# Patient Record
Sex: Male | Born: 1985
Health system: Southern US, Community
[De-identification: ages and names within clinical notes are randomized; demographics above are authoritative.]

## PROBLEM LIST (undated history)

## (undated) DIAGNOSIS — Z789 Other specified health status: Secondary | ICD-10-CM

---

## 2015-04-24 ENCOUNTER — Emergency Department
Admission: EM | Admit: 2015-04-24 | Discharge: 2015-04-24 | Disposition: A | Discharge status: Home / Self Care | Payer: BLUE CROSS/BLUE SHIELD | Attending: Emergency Medicine | Admitting: Emergency Medicine

## 2015-04-24 ENCOUNTER — Encounter: Payer: Self-pay | Admitting: Emergency Medicine

## 2015-04-24 DIAGNOSIS — Z720 Tobacco use: Secondary | ICD-10-CM | POA: Insufficient documentation

## 2015-04-24 DIAGNOSIS — R197 Diarrhea, unspecified: Secondary | ICD-10-CM | POA: Diagnosis not present

## 2015-04-24 DIAGNOSIS — R109 Unspecified abdominal pain: Secondary | ICD-10-CM | POA: Diagnosis present

## 2015-04-24 LAB — URINALYSIS COMPLETE WITH MICROSCOPIC (ARMC ONLY)
Bacteria, UA: NONE SEEN
Bilirubin Urine: NEGATIVE
Glucose, UA: NEGATIVE mg/dL
Hgb urine dipstick: NEGATIVE
Leukocytes, UA: NEGATIVE
Nitrite: NEGATIVE
Protein, ur: NEGATIVE mg/dL
Specific Gravity, Urine: 1.021 (ref 1.005–1.030)
pH: 6 (ref 5.0–8.0)

## 2015-04-24 LAB — COMPREHENSIVE METABOLIC PANEL
ALT: 37 U/L (ref 17–63)
AST: 28 U/L (ref 15–41)
Albumin: 4.4 g/dL (ref 3.5–5.0)
Alkaline Phosphatase: 69 U/L (ref 38–126)
Anion gap: 10 (ref 5–15)
BUN: 11 mg/dL (ref 6–20)
CO2: 24 mmol/L (ref 22–32)
Calcium: 9.3 mg/dL (ref 8.9–10.3)
Chloride: 104 mmol/L (ref 101–111)
Creatinine, Ser: 1.04 mg/dL (ref 0.61–1.24)
GFR calc Af Amer: >60 mL/min (ref >60)
GFR calc non Af Amer: >60 mL/min (ref >60)
Glucose, Bld: 90 mg/dL (ref 65–99)
Potassium: 3.7 mmol/L (ref 3.5–5.1)
Sodium: 138 mmol/L (ref 135–145)
Total Bilirubin: 1.3 mg/dL — ABNORMAL HIGH (ref 0.3–1.2)
Total Protein: 7.4 g/dL (ref 6.5–8.1)

## 2015-04-24 LAB — CBC WITH DIFFERENTIAL/PLATELET
Basophils Absolute: 0 10*3/uL (ref 0–0.1)
Basophils Relative: 1 %
Eosinophils Absolute: 0.1 10*3/uL (ref 0–0.7)
Eosinophils Relative: 2 %
HCT: 51.1 % (ref 40.0–52.0)
Hemoglobin: 17.3 g/dL (ref 13.0–18.0)
Lymphocytes Relative: 23 %
Lymphs Abs: 1.9 10*3/uL (ref 1.0–3.6)
MCH: 30.9 pg (ref 26.0–34.0)
MCHC: 33.8 g/dL (ref 32.0–36.0)
MCV: 91.3 fL (ref 80.0–100.0)
Monocytes Absolute: 0.8 10*3/uL (ref 0.2–1.0)
Monocytes Relative: 9 %
Neutro Abs: 5.3 10*3/uL (ref 1.4–6.5)
Neutrophils Relative %: 65 %
Platelets: 220 10*3/uL (ref 150–440)
RBC: 5.6 MIL/uL (ref 4.40–5.90)
RDW: 12.7 % (ref 11.5–14.5)
WBC: 8.1 10*3/uL (ref 3.8–10.6)

## 2015-04-24 MED ORDER — DICYCLOMINE HCL 10 MG PO CAPS
20.0000 mg | ORAL_CAPSULE | Freq: Once | ORAL | Status: AC
  Administered 2015-04-24: 20 mg via ORAL

## 2015-04-24 MED ORDER — DICYCLOMINE HCL 20 MG PO TABS: 20.0000 mg | ORAL_TABLET | Freq: Three times a day (TID) | ORAL | Status: DC | PRN

## 2015-04-24 MED ORDER — DICYCLOMINE HCL 10 MG PO CAPS
ORAL_CAPSULE | ORAL | Status: AC
  Administered 2015-04-24: 20 mg via ORAL
  Filled 2015-04-24: qty 2

## 2015-04-24 NOTE — ED Provider Notes (Signed)
[facility] Emergency Department Provider Note  Time seen: 6:10 PM  I have reviewed the triage vital signs and the nursing notes.   HISTORY  Chief Complaint Abdominal Pain    HPI Alex Cruz is a 29 y.o. male with no past medical history presents the emergency department for 3 weeks of abdominal cramping and diarrhea. According to the patient for the past 3 weeks he has been having loose stool every day approximately 4-5 times per day. He went to an urgent care 6 days ago and they prescribed him with ciprofloxacin and Flagyl. He states he has been taking these but has not had any relief. Denies any abdominal pain, bloody stool or black stool. He does state he occasionally feels cramping sensation in the abdomen especially before having a bowel movement. Denies any fever. Describes his symptoms as moderate in severity. He is not taking any Imodium.     History reviewed. No pertinent past medical history.  There are no active problems to display for this patient.   History reviewed. No pertinent past surgical history.  No current outpatient prescriptions on file.  Allergies Review of patient's allergies indicates no known allergies.  No family history on file.  Social History History  Substance Use Topics  . Smoking status: Current Every Day Smoker    Types: Cigarettes  . Smokeless tobacco: Not on file  . Alcohol Use: Yes     Comment: social    Review of Systems Constitutional: Negative for fever. Cardiovascular: Negative for chest pain. Respiratory: Negative for shortness of breath. Gastrointestinal: Positive for abdominal cramping and diarrhea. Negative for nausea or vomiting. Genitourinary: Negative for dysuria. Musculoskeletal: Negative for back pain. 10-point ROS otherwise negative.  ____________________________________________   PHYSICAL EXAM:  VITAL SIGNS: ED Triage Vitals  Enc Vitals Group     BP 04/24/15 1713 140/95 mmHg      Pulse Rate 04/24/15 1713 88     Resp 04/24/15 1713 18     Temp 04/24/15 1713 98.3 F (36.8 C)     Temp Source 04/24/15 1713 Oral     SpO2 04/24/15 1713 97 %     Weight 04/24/15 1713 183 lb (83.008 kg)     Height 04/24/15 1713  (1.702 m)     Head Cir --      Peak Flow --      Pain Score 04/24/15 1713 8     Pain Loc --      Pain Edu? --      Excl. in GC? --     Constitutional: Alert and oriented. Well appearing and in no distress. ENT   Mouth/Throat: Mucous membranes are moist. Cardiovascular: Normal rate, regular rhythm. No murmur Respiratory: Normal respiratory effort without tachypnea nor retractions. Breath sounds are clear and equal Gastrointestinal: Soft and nontender. No distention.  There is no CVA tenderness. Musculoskeletal: Nontender with normal range of motion in all extremities. Neurologic:  Normal speech and language. No gross focal neurologic deficits Skin:  Skin is warm, dry and intact.  Psychiatric: Mood and affect are normal. Speech and behavior are normal.   ____________________________________________    INITIAL IMPRESSION / ASSESSMENT AND PLAN / ED COURSE  Pertinent labs & imaging results that were available during my care of the patient were reviewed by me and considered in my medical decision making (see chart for details).  Largely normal exam, no abdominal tenderness, no CVA tenderness. We will check labs, urinalysis, and treat with Bentyl. Patient currently  taking Cipro and Flagyl. If labs are within normal limits we will discharge patient on Bentyl, I discussed using Imodium with the patient which is agreeable. He will follow-up with his primary care doctor.  Labs largely within normal limits, we'll discharge the patient on Bentyl, Imodium as needed, and primary care follow-up. Patient agreeable to plan.  ____________________________________________   FINAL CLINICAL IMPRESSION(S) / ED DIAGNOSES  Diarrhea   Minna AntisKevin Jenean Escandon,  MD 04/24/15 [phone]

## 2015-04-24 NOTE — ED Notes (Signed)
Pt c/o abd cramping with diarrhea for 3 weeks now, ha the past few days.

## 2015-04-24 NOTE — Discharge Instructions (Signed)

## 2015-04-26 ENCOUNTER — Encounter: Payer: Self-pay | Admitting: Gastroenterology

## 2015-05-24 ENCOUNTER — Ambulatory Visit: Payer: BLUE CROSS/BLUE SHIELD | Admitting: Gastroenterology

## 2016-08-27 ENCOUNTER — Emergency Department
Admission: EM | Admit: 2016-08-27 | Discharge: 2016-08-27 | Disposition: A | Discharge status: Home / Self Care | Payer: BLUE CROSS/BLUE SHIELD | Attending: Emergency Medicine | Admitting: Emergency Medicine

## 2016-08-27 ENCOUNTER — Emergency Department: Payer: BLUE CROSS/BLUE SHIELD

## 2016-08-27 DIAGNOSIS — R1032 Left lower quadrant pain: Secondary | ICD-10-CM | POA: Diagnosis present

## 2016-08-27 DIAGNOSIS — K5732 Diverticulitis of large intestine without perforation or abscess without bleeding: Secondary | ICD-10-CM | POA: Diagnosis not present

## 2016-08-27 LAB — URINALYSIS COMPLETE WITH MICROSCOPIC (ARMC ONLY)
Bacteria, UA: NONE SEEN
Bilirubin Urine: NEGATIVE
Glucose, UA: NEGATIVE mg/dL
Hgb urine dipstick: NEGATIVE
Ketones, ur: NEGATIVE mg/dL
Leukocytes, UA: NEGATIVE
Nitrite: NEGATIVE
Protein, ur: NEGATIVE mg/dL
RBC / HPF: NONE SEEN RBC/hpf (ref 0–5)
Specific Gravity, Urine: 1.025 (ref 1.005–1.030)
Squamous Epithelial / LPF: NONE SEEN
pH: 6 (ref 5.0–8.0)

## 2016-08-27 LAB — COMPREHENSIVE METABOLIC PANEL
ALT: 44 U/L (ref 17–63)
AST: 34 U/L (ref 15–41)
Albumin: 4.3 g/dL (ref 3.5–5.0)
Alkaline Phosphatase: 61 U/L (ref 38–126)
Anion gap: 8 (ref 5–15)
BUN: 15 mg/dL (ref 6–20)
CO2: 26 mmol/L (ref 22–32)
Calcium: 9.2 mg/dL (ref 8.9–10.3)
Chloride: 105 mmol/L (ref 101–111)
Creatinine, Ser: 0.93 mg/dL (ref 0.61–1.24)
GFR calc Af Amer: >60 mL/min (ref >60)
GFR calc non Af Amer: >60 mL/min (ref >60)
Glucose, Bld: 100 mg/dL — ABNORMAL HIGH (ref 65–99)
Potassium: 4 mmol/L (ref 3.5–5.1)
Sodium: 139 mmol/L (ref 135–145)
Total Bilirubin: 0.7 mg/dL (ref 0.3–1.2)
Total Protein: 7.4 g/dL (ref 6.5–8.1)

## 2016-08-27 LAB — CBC
HCT: 48.3 % (ref 40.0–52.0)
Hemoglobin: 17 g/dL (ref 13.0–18.0)
MCH: 31.7 pg (ref 26.0–34.0)
MCHC: 35.2 g/dL (ref 32.0–36.0)
MCV: 90 fL (ref 80.0–100.0)
Platelets: 221 10*3/uL (ref 150–440)
RBC: 5.36 MIL/uL (ref 4.40–5.90)
RDW: 12.9 % (ref 11.5–14.5)
WBC: 6.9 10*3/uL (ref 3.8–10.6)

## 2016-08-27 LAB — LIPASE, BLOOD: Lipase: 20 U/L (ref 11–51)

## 2016-08-27 MED ORDER — METRONIDAZOLE 500 MG PO TABS
500.0000 mg | ORAL_TABLET | Freq: Once | ORAL | Status: AC
  Administered 2016-08-27: 500 mg via ORAL
  Filled 2016-08-27: qty 1

## 2016-08-27 MED ORDER — CIPROFLOXACIN HCL 500 MG PO TABS
500.0000 mg | ORAL_TABLET | Freq: Once | ORAL | Status: AC
  Administered 2016-08-27: 500 mg via ORAL
  Filled 2016-08-27: qty 1

## 2016-08-27 MED ORDER — OXYCODONE-ACETAMINOPHEN 5-325 MG PO TABS: 2.0000 | ORAL_TABLET | Freq: Four times a day (QID) | ORAL | 0 refills | Status: DC | PRN

## 2016-08-27 MED ORDER — ONDANSETRON HCL 4 MG PO TABS: 4.0000 mg | ORAL_TABLET | Freq: Every day | ORAL | 1 refills | Status: DC | PRN

## 2016-08-27 MED ORDER — HYDROMORPHONE HCL 1 MG/ML IJ SOLN
0.5000 mg | Freq: Once | INTRAMUSCULAR | Status: AC
  Administered 2016-08-27: 0.5 mg via INTRAVENOUS
  Filled 2016-08-27: qty 1

## 2016-08-27 MED ORDER — CIPROFLOXACIN HCL 500 MG PO TABS: 500.0000 mg | ORAL_TABLET | Freq: Two times a day (BID) | ORAL | 0 refills | Status: AC

## 2016-08-27 MED ORDER — METRONIDAZOLE 500 MG PO TABS: 500.0000 mg | ORAL_TABLET | Freq: Three times a day (TID) | ORAL | 0 refills | Status: DC

## 2016-08-27 MED ORDER — KETOROLAC TROMETHAMINE 30 MG/ML IJ SOLN
30.0000 mg | Freq: Once | INTRAMUSCULAR | Status: AC
  Administered 2016-08-27: 30 mg via INTRAVENOUS
  Filled 2016-08-27: qty 1

## 2016-08-27 NOTE — ED Triage Notes (Signed)
Pt c/o LLQ pain for the past 3 days.. Denies N/V/D.Marland Kitchen. Last BM was yesterday..Marland Kitchen

## 2016-08-27 NOTE — ED Provider Notes (Signed)
[facility] Emergency Department Provider Note        Time seen: ----------------------------------------- 9:37 AM on 08/27/2016 -----------------------------------------    I have reviewed the triage vital signs and the nursing notes.   HISTORY  Chief Complaint Abdominal Pain (LLQ)    HPI Alex Cruz is a 30 y.o. male who presents to the ER for left lower quadrant pain for the last 3 days. Patient states the pain is worse with movement, nothing makes it better. He denies fevers, chills, vomiting or diarrhea. He's never had a history of this before, denies blood in his stool or urine.   History reviewed. No pertinent past medical history.  There are no active problems to display for this patient.   History reviewed. No pertinent surgical history.  Allergies Patient has no known allergies.  Social History Social History  Substance Use Topics  . Smoking status: Never Smoker  . Smokeless tobacco: Never Used  . Alcohol use Yes     Comment: social    Review of Systems Constitutional: Negative for fever. Cardiovascular: Negative for chest pain. Respiratory: Negative for shortness of breath. Gastrointestinal: Positive for abdominal pain Genitourinary: Negative for dysuria. Musculoskeletal: Negative for back pain. Skin: Negative for rash. Neurological: Negative for headaches, focal weakness or numbness.  10-point ROS otherwise negative.  ____________________________________________   PHYSICAL EXAM:  VITAL SIGNS: ED Triage Vitals  Enc Vitals Group     BP 08/27/16 0840 133/77     Pulse Rate 08/27/16 0840 88     Resp 08/27/16 0840 20     Temp 08/27/16 0840 97.8 F (36.6 C)     Temp Source 08/27/16 0840 Oral     SpO2 08/27/16 0840 97 %     Weight 08/27/16 0840 185 lb (83.9 kg)     Height 08/27/16 0840 5\' 7"  (1.702 m)     Head Circumference --      Peak Flow --      Pain Score 08/27/16 0843 7     Pain Loc --      Pain Edu? --       Excl. in GC? --     Constitutional: Alert and oriented. Well appearing and in no distress. Eyes: Conjunctivae are normal. PERRL. Normal extraocular movements. ENT   Head: Normocephalic and atraumatic.   Nose: No congestion/rhinnorhea.   Mouth/Throat: Mucous membranes are moist.   Neck: No stridor. Cardiovascular: Normal rate, regular rhythm. No murmurs, rubs, or gallops. Respiratory: Normal respiratory effort without tachypnea nor retractions. Breath sounds are clear and equal bilaterally. No wheezes/rales/rhonchi. Gastrointestinal: Left lower quadrant tenderness, no rebound or guarding. Normal bowel sounds. Musculoskeletal: Nontender with normal range of motion in all extremities. No lower extremity tenderness nor edema. Neurologic:  Normal speech and language. No gross focal neurologic deficits are appreciated.  Skin:  Skin is warm, dry and intact. No rash noted. Psychiatric: Mood and affect are normal. Speech and behavior are normal.  ____________________________________________  ED COURSE:  Pertinent labs & imaging results that were available during my care of the patient were reviewed by me and considered in my medical decision making (see chart for details). Clinical Course   Patient is in no distress but very tender on examination. We will assess with labs and imaging.  Procedures ____________________________________________   LABS (pertinent positives/negatives)  Labs Reviewed  COMPREHENSIVE METABOLIC PANEL - Abnormal; Notable for the following:       Result Value   Glucose, Bld 100 (*)    All  other components within normal limits  URINALYSIS COMPLETEWITH MICROSCOPIC ([facility] ONLY) - Abnormal; Notable for the following:    Color, Urine YELLOW (*)    APPearance CLEAR (*)    All other components within normal limits  LIPASE, BLOOD  CBC    RADIOLOGY Images were viewed by me  CT renal protocol IMPRESSION: There is focal diverticulitis in the proximal  sigmoid colon with mild wall thickening and mesenteric thickening in this area. No abscess or perforation evident. There are diverticula throughout colon elsewhere without inflammation.  No bowel obstruction. No abscess. Appendix appears normal. Rectum is distended with stool.  Urinary bladder wall thickening is concerning for a degree of cystitis. There is no hydronephrosis on either side. No renal or ureteral calculi present on either side.  ____________________________________________  FINAL ASSESSMENT AND PLAN  Flank pain, diverticulitis  Plan: Patient with labs and imaging as dictated above. Patient with uncomplicated diverticulitis without leading her abscess. His labs are reassuring, he's been started on Cipro and Flagyl, he's been advised about increasing fiber intake. He is stable for outpatient follow-up with his doctor.   Emily FilbertWilliams, Alex E, MD   Note: This dictation was prepared with Dragon dictation. Any transcriptional errors that result from this process are unintentional    Emily FilbertJonathan E Williams, MD 08/27/16 1122

## 2016-11-29 ENCOUNTER — Ambulatory Visit
Admission: EM | Admit: 2016-11-29 | Discharge: 2016-11-29 | Disposition: A | Discharge status: Home / Self Care | Payer: BLUE CROSS/BLUE SHIELD | Attending: Family Medicine | Admitting: Family Medicine

## 2016-11-29 ENCOUNTER — Ambulatory Visit (INDEPENDENT_AMBULATORY_CARE_PROVIDER_SITE_OTHER): Payer: BLUE CROSS/BLUE SHIELD

## 2016-11-29 DIAGNOSIS — J189 Pneumonia, unspecified organism: Secondary | ICD-10-CM

## 2016-11-29 DIAGNOSIS — J9811 Atelectasis: Secondary | ICD-10-CM

## 2016-11-29 MED ORDER — PREDNISONE 10 MG (21) PO TBPK: ORAL_TABLET | ORAL | 0 refills | Status: DC

## 2016-11-29 MED ORDER — HYDROCOD POLST-CPM POLST ER 10-8 MG/5ML PO SUER: 5.0000 mL | Freq: Two times a day (BID) | ORAL | 0 refills | Status: DC | PRN

## 2016-11-29 MED ORDER — LEVOFLOXACIN 500 MG PO TABS: 500.0000 mg | ORAL_TABLET | Freq: Every day | ORAL | 0 refills | Status: DC

## 2016-11-29 NOTE — ED Triage Notes (Signed)
Pt c/o cough with sinus and chest congestion for the past month, states [facility] and was Rx abx and cough syrup and was feeling better until 3 days ago..Marland Kitchen

## 2016-11-29 NOTE — ED Provider Notes (Signed)
[facility]    CSN: [id number] Arrival date & time: 11/29/16  1618     History   Chief Complaint Chief Complaint  Patient presents with  . Cough    HPI Alex Cruz is a 31 y.o. male.   Patient is a 31 year old white male who is sitting reports having cough and congestion now for over a month. About 3 weeks ago he was seen at the [facility] and placed on doxycycline prednisone and a cough syrup. He reports initially improvement with the symptoms but then they've come back and the cough is progressively gotten worse. He's had no surgeries no past medical problems he does not smoke. There is no pertinent family medical history relevant to today's visit and no known drug allergies.   The history is provided by the patient. No language interpreter was used.  Cough  Cough characteristics:  Productive and hoarse Sputum characteristics:  Yellow and green Severity:  Moderate Duration:  4 weeks Timing:  Constant Progression:  Worsening Chronicity:  New Smoker: no   Context: upper respiratory infection   Context: not smoke exposure   Relieved by:  Cough suppressants and steroid inhaler (doxycycline) Worsened by:  Nothing   History reviewed. No pertinent past medical history.  There are no active problems to display for this patient.   History reviewed. No pertinent surgical history.     Home Medications    Prior to Admission medications   Medication Sig Start Date End Date Taking? Authorizing Provider  dextromethorphan-guaiFENesin (MUCINEX DM) 30-600 MG 12hr tablet Take 1 tablet by mouth 2 (two) times daily.   Yes Historical Provider, MD  chlorpheniramine-HYDROcodone (TUSSIONEX PENNKINETIC ER) 10-8 MG/5ML SUER Take 5 mLs by mouth every 12 (twelve) hours as needed for cough. 11/29/16   Hassan RowanEugene Aiden Rao, MD  levofloxacin (LEVAQUIN) 500 MG tablet Take 1 tablet (500 mg total) by mouth daily. 11/29/16   Hassan RowanEugene Elajah Kunsman, MD  metroNIDAZOLE (FLAGYL) 500 MG  tablet Take 1 tablet (500 mg total) by mouth 3 (three) times daily. 08/27/16   Emily FilbertJonathan E Williams, MD  ondansetron (ZOFRAN) 4 MG tablet Take 1 tablet (4 mg total) by mouth daily as needed for nausea or vomiting. 08/27/16   Emily FilbertJonathan E Williams, MD  oxyCODONE-acetaminophen (PERCOCET) 5-325 MG tablet Take 2 tablets by mouth every 6 (six) hours as needed for moderate pain or severe pain. 08/27/16   Emily FilbertJonathan E Williams, MD  predniSONE (STERAPRED UNI-PAK 21 TAB) 10 MG (21) TBPK tablet Sig 6 tablet day 1, 5 tablets day 2, 4 tablets day 3,,3tablets day 4, 2 tablets day 5, 1 tablet day 6 take all tablets orally 11/29/16   Hassan RowanEugene Deauna Yaw, MD    Family History No family history on file.  Social History Social History  Substance Use Topics  . Smoking status: Never Smoker  . Smokeless tobacco: Never Used  . Alcohol use Yes     Comment: social     Allergies   Patient has no known allergies.   Review of Systems Review of Systems  Respiratory: Positive for cough.   All other systems reviewed and are negative.    Physical Exam Triage Vital Signs ED Triage Vitals  Enc Vitals Group     BP 11/29/16 1658 (!) 151/69     Pulse Rate 11/29/16 1658 90     Resp 11/29/16 1658 18     Temp 11/29/16 1658 97.7 F (36.5 C)     Temp Source 11/29/16 1658 Oral  SpO2 11/29/16 1658 99 %     Weight 11/29/16 1659 192 lb (87.1 kg)     Height 11/29/16 1659 5\' 7"  (1.702 m)     Head Circumference --      Peak Flow --      Pain Score --      Pain Loc --      Pain Edu? --      Excl. in GC? --    No data found.   Updated Vital Signs BP (!) 151/69 (BP Location: Left Arm)   Pulse 90   Temp 97.7 F (36.5 C) (Oral)   Resp 18   Ht 5\' 7"  (1.702 m)   Wt 192 lb (87.1 kg)   SpO2 99%   BMI 30.07 kg/m   Visual Acuity Right Eye Distance:   Left Eye Distance:   Bilateral Distance:    Right Eye Near:   Left Eye Near:    Bilateral Near:     Physical Exam  Constitutional: He appears well-developed and  well-nourished. No distress.  HENT:  Head: Normocephalic.  Right Ear: External ear normal.  Left Ear: External ear normal.  Mouth/Throat: Oropharynx is clear and moist.  Eyes: Pupils are equal, round, and reactive to light.  Neck: Normal range of motion. Neck supple.  Cardiovascular: Normal rate and regular rhythm.   Pulmonary/Chest: Effort normal.  Musculoskeletal: Normal range of motion.  Neurological: He is alert.  Skin: Skin is warm. He is not diaphoretic.  Psychiatric: He has a normal mood and affect.  Vitals reviewed.    UC Treatments / Results  Labs (all labs ordered are listed, but only abnormal results are displayed) Labs Reviewed - No data to display  EKG  EKG Interpretation None       Radiology Dg Chest 2 View  Result Date: 11/29/2016 CLINICAL DATA:  Cough congestion EXAM: CHEST  2 VIEW COMPARISON:  None. FINDINGS: Normal cardiac silhouette. The subtle bandlike lingula lingular density seen on lateral projection. Normal pulmonary vasculature. No pleural fluid or pneumothorax. IMPRESSION: Lingular atelectasis versus less likely infiltrate. These results will be called to the ordering clinician or representative by the Radiologist Assistant, and communication documented in the PACS or zVision Dashboard. Electronically Signed   By: Genevive Bi M.D.   On: 11/29/2016 18:21    Procedures Procedures (including critical care time)  Medications Ordered in UC Medications - No data to display   Initial Impression / Assessment and Plan / UC Course  I have reviewed the triage vital signs and the nursing notes.  Pertinent labs & imaging results that were available during my care of the patient were reviewed by me and considered in my medical decision making (see chart for details).   patient with atelectasis on chest x-ray versus pneumonia. Will treat for pneumonia because of the left time the cough has been there place on Levaquin 500 mg 1 tablet a day Tussionex 1  teaspoon twice a day prednisone for 6 days. Work note for the next 2 days today and tomorrow follow-up PCP in about 3 weeks for repeat chest x-ray or if symptoms get worse.    Final Clinical Impressions(s) / UC Diagnoses   Final diagnoses:  Linear atelectasis  Community acquired pneumonia of left lung, unspecified part of lung    New Prescriptions New Prescriptions   CHLORPHENIRAMINE-HYDROCODONE (TUSSIONEX PENNKINETIC ER) 10-8 MG/5ML SUER    Take 5 mLs by mouth every 12 (twelve) hours as needed for cough.   LEVOFLOXACIN (LEVAQUIN) 500  MG TABLET    Take 1 tablet (500 mg total) by mouth daily.   PREDNISONE (STERAPRED UNI-PAK 21 TAB) 10 MG (21) TBPK TABLET    Sig 6 tablet day 1, 5 tablets day 2, 4 tablets day 3,,3tablets day 4, 2 tablets day 5, 1 tablet day 6 take all tablets orally     Note: This dictation was prepared with Dragon dictation along with smaller phrase technology. Any transcriptional errors that result from this process are unintentional.   Hassan Rowan, MD 11/29/16 2134

## 2017-06-03 ENCOUNTER — Encounter: Payer: Self-pay | Admitting: *Deleted

## 2017-06-03 ENCOUNTER — Ambulatory Visit
Admission: EM | Admit: 2017-06-03 | Discharge: 2017-06-03 | Disposition: A | Discharge status: Home / Self Care | Payer: BLUE CROSS/BLUE SHIELD | Attending: Family Medicine | Admitting: Family Medicine

## 2017-06-03 DIAGNOSIS — M545 Low back pain: Secondary | ICD-10-CM | POA: Diagnosis not present

## 2017-06-03 DIAGNOSIS — S39012A Strain of muscle, fascia and tendon of lower back, initial encounter: Secondary | ICD-10-CM | POA: Diagnosis not present

## 2017-06-03 MED ORDER — NAPROXEN 500 MG PO TABS: 500.0000 mg | ORAL_TABLET | Freq: Two times a day (BID) | ORAL | 0 refills | Status: DC

## 2017-06-03 MED ORDER — KETOROLAC TROMETHAMINE 60 MG/2ML IM SOLN
60.0000 mg | Freq: Once | INTRAMUSCULAR | Status: AC
  Administered 2017-06-03: 60 mg via INTRAMUSCULAR

## 2017-06-03 MED ORDER — METAXALONE 800 MG PO TABS
800.0000 mg | ORAL_TABLET | Freq: Three times a day (TID) | ORAL | 0 refills | Status: DC
Start: 2017-06-03 — End: 2017-10-08

## 2017-06-03 NOTE — ED Provider Notes (Signed)
[facility]    CSN: [id number] Arrival date & time: 06/03/17  1425     History   Chief Complaint Chief Complaint  Patient presents with  . Back Pain    HPI Alex Cruz is a 31 y.o. male.   HPI  This a 31 year old male presents with a low back pain on the left side that radiates to approximately mid thigh posteriorly. His had the symptoms for 5 days. 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 Had back problems in the past even undergoing MRI evaluation and was told he has bulging and herniated disc. He is attempting to find a primary care physician for referrals to specialists. He is taking ibuprofen without much success. He does not have any incontinence. He states all motions are very painful. Any change in position is very painful.          History reviewed. No pertinent past medical history.  There are no active problems to display for this patient.   History reviewed. No pertinent surgical history.     Home Medications    Prior to Admission medications   Medication Sig Start Date End Date Taking? Authorizing Provider  metaxalone (SKELAXIN) 800 MG tablet Take 1 tablet (800 mg total) by mouth 3 (three) times daily. 06/03/17   Lutricia Feil, PA-C  naproxen (NAPROSYN) 500 MG tablet Take 1 tablet (500 mg total) by mouth 2 (two) times daily with a meal. 06/03/17   Lutricia Feil, PA-C  oxyCODONE-acetaminophen (PERCOCET) 5-325 MG tablet Take 2 tablets by mouth every 6 (six) hours as needed for moderate pain or severe pain. 08/27/16   Emily Filbert, MD    Family History History reviewed. No pertinent family history.  Social History Social History  Substance Use Topics  . Smoking status: Never Smoker  . Smokeless tobacco: Never Used  . Alcohol use Yes     Comment: social     Allergies   Patient has no known allergies.   Review of Systems Review of Systems  Constitutional:  Positive for activity change. Negative for appetite change, chills, fatigue and fever.  Musculoskeletal: Positive for back pain and myalgias.  All other systems reviewed and are negative.    Physical Exam Triage Vital Signs ED Triage Vitals  Enc Vitals Group     BP 06/03/17 1443 140/82     Pulse Rate 06/03/17 1443 75     Resp 06/03/17 1443 16     Temp 06/03/17 1443 97.9 F (36.6 C)     Temp Source 06/03/17 1443 Oral     SpO2 06/03/17 1443 100 %     Weight 06/03/17 1445 185 lb (83.9 kg)     Height 06/03/17 1445 5\' 7"  (1.702 m)     Head Circumference --      Peak Flow --      Pain Score 06/03/17 1446 9     Pain Loc --      Pain Edu? --      Excl. in GC? --    No data found.   Updated Vital Signs BP 140/82 (BP Location: Left Arm)   Pulse 75   Temp 97.9 F (36.6 C) (Oral)   Resp 16   Ht 5\' 7"  (1.702 m)   Wt 185 lb (83.9 kg)   SpO2 100%   BMI 28.98 kg/m   Visual Acuity Right Eye Distance:   Left Eye Distance:   Bilateral Distance:  Right Eye Near:   Left Eye Near:    Bilateral Near:     Physical Exam  Constitutional: He is oriented to person, place, and time. He appears well-developed and well-nourished. No distress.  HENT:  Head: Normocephalic.  Eyes: Pupils are equal, round, and reactive to light.  Neck: Normal range of motion.  Musculoskeletal: He exhibits tenderness.  Examination of lumbar spine shows a level pelvis in stance. He does have visible muscle spasm in the left paraspinous muscles in the lower segment. He is very limited range of motion with forward flexion causing a great deal of pain returning to the upright posture is also painful. He is able to toe and heel walk adequately. Sensation intact distally to light touch. DTRs are 2+ over 4 and symmetrical. Her leg raise testing is positive to 85-90 on the left with low back pain. There is no crossover component on the right.  Neurological: He is alert and oriented to person, place, and time.    Skin: Skin is warm and dry. He is not diaphoretic.  Psychiatric: He has a normal mood and affect. His behavior is normal. Judgment and thought content normal.  Nursing note and vitals reviewed.    UC Treatments / Results  Labs (all labs ordered are listed, but only abnormal results are displayed) Labs Reviewed - No data to display  EKG  EKG Interpretation None       Radiology No results found.  Procedures Procedures (including critical care time)  Medications Ordered in UC Medications  ketorolac (TORADOL) injection 60 mg (60 mg Intramuscular Given 06/03/17 1522)     Initial Impression / Assessment and Plan / UC Course  I have reviewed the triage vital signs and the nursing notes.  Pertinent labs & imaging results that were available during my care of the patient were reviewed by me and considered in my medical decision making (see chart for details). Plan: 1. Test/x-ray results and diagnosis reviewed with patient 2. rx as per orders; risks, benefits, potential side effects reviewed with patient 3. Recommend supportive treatment with rest and symptom avoidance. Use ice 20 minutes every 2 hours for 5 times daily. Caution when performing activities that require concentration and judgment while using the Skelaxin. Also do not drive for taking the Skelaxin. Recommend Following up with a primary care physician next week. 4. F/u prn if symptoms worsen or don't improve       Final Clinical Impressions(s) / UC Diagnoses   Final diagnoses:  Strain of lumbar region, initial encounter    New Prescriptions Discharge Medication List as of 06/03/2017  3:28 PM    START taking these medications   Details  metaxalone (SKELAXIN) 800 MG tablet Take 1 tablet (800 mg total) by mouth 3 (three) times daily., Starting Tue 06/03/2017, Print    naproxen (NAPROSYN) 500 MG tablet Take 1 tablet (500 mg total) by mouth 2 (two) times daily with a meal., Starting Tue 06/03/2017, Print          Controlled Substance Prescriptions [place] Controlled Substance Registry consulted? Not Applicable   Lutricia FeilRoemer, William P, PA-C 06/03/17 1605

## 2017-06-03 NOTE — ED Triage Notes (Signed)
Gradual onset low back pain since last Thursday which radiates to left leg. Denies injury.

## 2017-06-11 DIAGNOSIS — M545 Low back pain, unspecified: Secondary | ICD-10-CM | POA: Insufficient documentation

## 2017-06-11 DIAGNOSIS — Z8739 Personal history of other diseases of the musculoskeletal system and connective tissue: Secondary | ICD-10-CM | POA: Insufficient documentation

## 2017-06-16 ENCOUNTER — Other Ambulatory Visit: Payer: Self-pay | Admitting: Orthopedic Surgery

## 2017-06-16 DIAGNOSIS — M5416 Radiculopathy, lumbar region: Secondary | ICD-10-CM

## 2017-06-16 DIAGNOSIS — M545 Low back pain, unspecified: Secondary | ICD-10-CM

## 2017-06-16 DIAGNOSIS — M5440 Lumbago with sciatica, unspecified side: Secondary | ICD-10-CM

## 2017-06-20 ENCOUNTER — Ambulatory Visit: Admission: RE | Admit: 2017-06-20 | Payer: BLUE CROSS/BLUE SHIELD | Source: Ambulatory Visit

## 2017-07-05 ENCOUNTER — Ambulatory Visit
Admission: RE | Admit: 2017-07-05 | Discharge: 2017-07-05 | Disposition: A | Discharge status: Home / Self Care | Payer: BLUE CROSS/BLUE SHIELD | Source: Ambulatory Visit | Attending: Orthopedic Surgery | Admitting: Orthopedic Surgery

## 2017-07-05 ENCOUNTER — Ambulatory Visit: Payer: BLUE CROSS/BLUE SHIELD

## 2017-07-05 DIAGNOSIS — Z8739 Personal history of other diseases of the musculoskeletal system and connective tissue: Secondary | ICD-10-CM | POA: Diagnosis present

## 2017-07-05 DIAGNOSIS — M545 Low back pain, unspecified: Secondary | ICD-10-CM

## 2017-07-05 DIAGNOSIS — M5126 Other intervertebral disc displacement, lumbar region: Secondary | ICD-10-CM | POA: Insufficient documentation

## 2017-07-05 DIAGNOSIS — M5416 Radiculopathy, lumbar region: Secondary | ICD-10-CM

## 2017-07-05 DIAGNOSIS — M5127 Other intervertebral disc displacement, lumbosacral region: Secondary | ICD-10-CM | POA: Insufficient documentation

## 2017-07-05 DIAGNOSIS — M5440 Lumbago with sciatica, unspecified side: Secondary | ICD-10-CM

## 2017-10-08 ENCOUNTER — Encounter: Payer: Self-pay | Admitting: Emergency Medicine

## 2017-10-08 ENCOUNTER — Ambulatory Visit
Admission: EM | Admit: 2017-10-08 | Discharge: 2017-10-08 | Disposition: A | Discharge status: Home / Self Care | Payer: BLUE CROSS/BLUE SHIELD | Attending: Family Medicine | Admitting: Family Medicine

## 2017-10-08 ENCOUNTER — Other Ambulatory Visit: Payer: Self-pay

## 2017-10-08 DIAGNOSIS — R05 Cough: Secondary | ICD-10-CM

## 2017-10-08 DIAGNOSIS — J069 Acute upper respiratory infection, unspecified: Secondary | ICD-10-CM

## 2017-10-08 HISTORY — DX: Other specified health status: Z78.9

## 2017-10-08 MED ORDER — AZITHROMYCIN 250 MG PO TABS
250.0000 mg | ORAL_TABLET | Freq: Every day | ORAL | 0 refills | Status: DC
Start: 2017-10-08 — End: 2018-11-14

## 2017-10-08 MED ORDER — HYDROCOD POLST-CPM POLST ER 10-8 MG/5ML PO SUER: 5.0000 mL | Freq: Two times a day (BID) | ORAL | 0 refills | Status: DC

## 2017-10-08 MED ORDER — BENZONATATE 200 MG PO CAPS: ORAL_CAPSULE | ORAL | 0 refills | Status: DC

## 2017-10-08 NOTE — ED Triage Notes (Signed)
Patient in today c/o 10 day history of cough, worsening in the last 2-3 days and becoming productive (thick clear/yellow). Patient denies fever, but has not checked temperature. Patient states he had pneumonia last year and feels the same way. Patient has tried OTC Nyquil/Dayquil, Airborne, cough drops without relief.

## 2017-10-08 NOTE — ED Provider Notes (Signed)
[facility]    CSN: [id number] Arrival date & time: 10/08/17  [id number]     History   Chief Complaint Chief Complaint  Patient presents with  . Cough    HPI Alex Cruz is a 31 y.o. male.   HPI  This is a 31 year old male who presents with a 3-week history cough more prominate for 10 days and  much more severe over the last 2-3 days.  He now has thick yellowish productive sputum.  He had no fever or chills.  Review of his medical records in February 2018 he was seen at this clinic diagnosed with a pneumonia placed on Levaquin.  The patient states that he improved quickly after that.  He denies any shortness of breath.  He has used over-the-counter NyQuil DayQuil airborne and cough drops without success.  Today he is afebrile his O2 sats on room air are 100%.  Is a non-smoker          Past Medical History:  Diagnosis Date  . No known health problems     There are no active problems to display for this patient.   History reviewed. No pertinent surgical history.     Home Medications    Prior to Admission medications   Medication Sig Start Date End Date Taking? Authorizing Provider  azithromycin (ZITHROMAX) 250 MG tablet Take 1 tablet (250 mg total) by mouth daily. Take first 2 tablets together, then 1 every day until finished. 10/08/17   Lutricia Feiloemer, Emmogene Simson P, PA-C  benzonatate (TESSALON) 200 MG capsule Take one cap TID PRN cough 10/08/17   Lutricia Feiloemer, Analiah Drum P, PA-C  chlorpheniramine-HYDROcodone [facility](TUSSIONEX PENNKINETIC ER) 10-8 MG/5ML SUER Take 5 mLs by mouth 2 (two) times daily. 10/08/17   Lutricia Feiloemer, Finch Costanzo P, PA-C    Family History Family History  Problem Relation Age of Onset  . Healthy Mother   . Hypertension Father   . Heart attack Father     Social History Social History   Tobacco Use  . Smoking status: Never Smoker  . Smokeless tobacco: Never Used  Substance Use Topics  . Alcohol use: Yes    Comment: social  . Drug use: No     Allergies    Patient has no known allergies.   Review of Systems Review of Systems  Constitutional: Positive for activity change. Negative for chills, fatigue and fever.  HENT: Positive for congestion and rhinorrhea.   Respiratory: Positive for cough. Negative for shortness of breath, wheezing and stridor.   All other systems reviewed and are negative.    Physical Exam Triage Vital Signs ED Triage Vitals  Enc Vitals Group     BP 10/08/17 0833 140/89     Pulse Rate 10/08/17 0833 88     Resp 10/08/17 0833 16     Temp 10/08/17 0833 98.1 F (36.7 C)     Temp Source 10/08/17 0833 Oral     SpO2 10/08/17 0833 100 %     Weight 10/08/17 0833 190 lb (86.2 kg)     Height 10/08/17 0833 5\' 7"  (1.702 m)     Head Circumference --      Peak Flow --      Pain Score 10/08/17 0834 1     Pain Loc --      Pain Edu? --      Excl. in GC? --    No data found.  Updated Vital Signs BP 140/89 (BP Location: Left Arm)   Pulse 88   Temp  98.1 F (36.7 C) (Oral)   Resp 16   Ht 5\' 7"  (1.702 m)   Wt 190 lb (86.2 kg)   SpO2 100%   BMI 29.76 kg/m   Visual Acuity Right Eye Distance:   Left Eye Distance:   Bilateral Distance:    Right Eye Near:   Left Eye Near:    Bilateral Near:     Physical Exam  Constitutional: He is oriented to person, place, and time. He appears well-developed and well-nourished. No distress.  HENT:  Head: Normocephalic.  Right Ear: External ear normal.  Left Ear: External ear normal.  Nose: Nose normal.  Mouth/Throat: Oropharynx is clear and moist. No oropharyngeal exudate.  Eyes: Pupils are equal, round, and reactive to light. Right eye exhibits no discharge. Left eye exhibits no discharge.  Neck: Normal range of motion.  Pulmonary/Chest: Effort normal and breath sounds normal.  Musculoskeletal: Normal range of motion.  Lymphadenopathy:    He has no cervical adenopathy.  Neurological: He is alert and oriented to person, place, and time.  Skin: Skin is warm and dry. He is  not diaphoretic.  Psychiatric: He has a normal mood and affect. His behavior is normal. Judgment and thought content normal.  Nursing note and vitals reviewed.    UC Treatments / Results  Labs (all labs ordered are listed, but only abnormal results are displayed) Labs Reviewed - No data to display  EKG  EKG Interpretation None       Radiology No results found.  Procedures Procedures (including critical care time)  Medications Ordered in UC Medications - No data to display   Initial Impression / Assessment and Plan / UC Course  I have reviewed the triage vital signs and the nursing notes.  Pertinent labs & imaging results that were available during my care of the patient were reviewed by me and considered in my medical decision making (see chart for details).     Plan: 1. Test/x-ray results and diagnosis reviewed with patient 2. rx as per orders; risks, benefits, potential side effects reviewed with patient 3. Recommend supportive treatment with increased fluids and rest.  Because the length of time that he has had the cough review his history of pneumonia although that is not suspected at this time I will place him on azithromycin as well as Tessalon and Tussionex.  If he is not improving or if he worsens he should return to our clinic or be seen in the emergency room. 4. F/u prn if symptoms worsen or don't improve   Final Clinical Impressions(s) / UC Diagnoses   Final diagnoses:  Acute upper respiratory infection    ED Discharge Orders        Ordered    azithromycin (ZITHROMAX) 250 MG tablet  Daily     10/08/17 0855    benzonatate (TESSALON) 200 MG capsule     10/08/17 0855    chlorpheniramine-HYDROcodone (TUSSIONEX PENNKINETIC ER) 10-8 MG/5ML SUER  2 times daily     10/08/17 0855       Controlled Substance Prescriptions [place] Controlled Substance Registry consulted? Not Applicable   Lutricia FeilRoemer, Brennyn Ortlieb P, PA-C 10/08/17 [id number]

## 2018-11-14 ENCOUNTER — Ambulatory Visit (INDEPENDENT_AMBULATORY_CARE_PROVIDER_SITE_OTHER): Payer: BLUE CROSS/BLUE SHIELD

## 2018-11-14 ENCOUNTER — Ambulatory Visit
Admission: EM | Admit: 2018-11-14 | Discharge: 2018-11-14 | Disposition: A | Discharge status: Home / Self Care | Payer: BLUE CROSS/BLUE SHIELD | Attending: Emergency Medicine | Admitting: Emergency Medicine

## 2018-11-14 ENCOUNTER — Other Ambulatory Visit: Payer: Self-pay

## 2018-11-14 ENCOUNTER — Encounter: Payer: Self-pay | Admitting: Gynecology

## 2018-11-14 DIAGNOSIS — R05 Cough: Secondary | ICD-10-CM | POA: Diagnosis not present

## 2018-11-14 DIAGNOSIS — J40 Bronchitis, not specified as acute or chronic: Secondary | ICD-10-CM | POA: Diagnosis not present

## 2018-11-14 MED ORDER — AZITHROMYCIN 250 MG PO TABS
250.0000 mg | ORAL_TABLET | Freq: Every day | ORAL | 0 refills | Status: DC
Start: 2018-11-14 — End: 2020-06-09

## 2018-11-14 MED ORDER — PREDNISONE 20 MG PO TABS: ORAL_TABLET | ORAL | 0 refills | Status: DC

## 2018-11-14 MED ORDER — HYDROCOD POLST-CPM POLST ER 10-8 MG/5ML PO SUER: 5.0000 mL | Freq: Two times a day (BID) | ORAL | 0 refills | Status: DC

## 2018-11-14 MED ORDER — BENZONATATE 200 MG PO CAPS: ORAL_CAPSULE | ORAL | 0 refills | Status: DC

## 2018-11-14 NOTE — ED Provider Notes (Signed)
[facility]    CSN: [id number] Arrival date & time: 11/14/18  1022     History   Chief Complaint Chief Complaint  Patient presents with  . Cough    HPI Alex Cruz is a 33 y.o. male.   HPI  33 year old male presents with cough that he has had for 3 weeks.  Been productive.  Does not smoke.  York SpanielSaid it seems to be getting better for a while and then came back more.  Over the last 2 nights she has had shaking chills.  Cough is much worse at nighttime.  Temperature today is 99.1 pulse rate is 96 blood pressure 117/70 O2 sats are 90% on room air.  Numerous over-the-counter medications which have not been successful in treating his illness       Past Medical History:  Diagnosis Date  . No known health problems     There are no active problems to display for this patient.   History reviewed. No pertinent surgical history.     Home Medications    Prior to Admission medications   Medication Sig Start Date End Date Taking? Authorizing Provider  azithromycin (ZITHROMAX) 250 MG tablet Take 1 tablet (250 mg total) by mouth daily. Take first 2 tablets together, then 1 every day until finished. 11/14/18   Lutricia Feiloemer, Avenly Roberge P, PA-C  benzonatate (TESSALON) 200 MG capsule Take one cap TID PRN cough 11/14/18   Lutricia Feiloemer, Maddock Finigan P, PA-C  chlorpheniramine-HYDROcodone [facility](TUSSIONEX PENNKINETIC ER) 10-8 MG/5ML SUER Take 5 mLs by mouth 2 (two) times daily. 11/14/18   Lutricia Feiloemer, Kashis Penley P, PA-C  predniSONE (DELTASONE) 20 MG tablet Take 2 tablets (40 mg) daily by mouth 11/14/18   Lutricia Feiloemer, Suellyn Meenan P, PA-C    Family History Family History  Problem Relation Age of Onset  . Healthy Mother   . Hypertension Father   . Heart attack Father     Social History Social History   Tobacco Use  . Smoking status: Never Smoker  . Smokeless tobacco: Never Used  Substance Use Topics  . Alcohol use: Yes    Comment: social  . Drug use: No     Allergies   Patient has no known  allergies.   Review of Systems Review of Systems  Constitutional: Positive for activity change, chills, fatigue and fever.  HENT: Positive for congestion.   Respiratory: Positive for cough.   All other systems reviewed and are negative.    Physical Exam Triage Vital Signs ED Triage Vitals  Enc Vitals Group     BP 11/14/18 1104 117/70     Pulse Rate 11/14/18 1104 96     Resp 11/14/18 1104 18     Temp 11/14/18 1104 99.1 F (37.3 C)     Temp Source 11/14/18 1104 Oral     SpO2 11/14/18 1104 98 %     Weight 11/14/18 1104 186 lb (84.4 kg)     Height 11/14/18 1104 5\' 7"  (1.702 m)     Head Circumference --      Peak Flow --      Pain Score 11/14/18 1103 7     Pain Loc --      Pain Edu? --      Excl. in GC? --    No data found.  Updated Vital Signs BP 117/70 (BP Location: Left Arm)   Pulse 96   Temp 99.1 F (37.3 C) (Oral)   Resp 18   Ht 5\' 7"  (1.702 m)   Wt 186  lb (84.4 kg)   SpO2 98%   BMI 29.13 kg/m   Visual Acuity Right Eye Distance:   Left Eye Distance:   Bilateral Distance:    Right Eye Near:   Left Eye Near:    Bilateral Near:     Physical Exam Vitals signs and nursing note reviewed.  Constitutional:      General: He is not in acute distress.    Appearance: Normal appearance. He is normal weight. He is not ill-appearing, toxic-appearing or diaphoretic.  HENT:     Head: Normocephalic and atraumatic.     Right Ear: Tympanic membrane and ear canal normal.     Left Ear: Tympanic membrane and ear canal normal.     Nose: Nose normal. No congestion or rhinorrhea.     Mouth/Throat:     Mouth: Mucous membranes are moist.     Pharynx: No oropharyngeal exudate or posterior oropharyngeal erythema.  Eyes:     General:        Right eye: No discharge.        Left eye: No discharge.     Conjunctiva/sclera: Conjunctivae normal.  Neck:     Musculoskeletal: Normal range of motion and neck supple.  Pulmonary:     Effort: Pulmonary effort is normal.     Breath  sounds: Normal breath sounds.  Chest:     Chest wall: Tenderness present.  Musculoskeletal: Normal range of motion.  Lymphadenopathy:     Cervical: No cervical adenopathy.  Skin:    General: Skin is warm and dry.  Neurological:     General: No focal deficit present.     Mental Status: He is alert and oriented to person, place, and time.  Psychiatric:        Mood and Affect: Mood normal.        Behavior: Behavior normal.        Thought Content: Thought content normal.        Judgment: Judgment normal.      UC Treatments / Results  Labs (all labs ordered are listed, but only abnormal results are displayed) Labs Reviewed - No data to display  EKG None  Radiology Dg Chest 2 View  Result Date: 11/14/2018 CLINICAL DATA:  Cough and chest congestion for 4 weeks. EXAM: CHEST - 2 VIEW COMPARISON:  11/29/2016 FINDINGS: The heart size and mediastinal contours are within normal limits. Both lungs are clear. The visualized skeletal structures are unremarkable. IMPRESSION: No active cardiopulmonary disease. Electronically Signed   By: Myles Rosenthal M.D.   On: 11/14/2018 13:55    Procedures Procedures (including critical care time)  Medications Ordered in UC Medications - No data to display  Initial Impression / Assessment and Plan / UC Course  I have reviewed the triage vital signs and the nursing notes.  Pertinent labs & imaging results that were available during my care of the patient were reviewed by me and considered in my medical decision making (see chart for details).   Has a bronchitis.  Treat with a prednisone with Romycin and cough suppressants.  Told him this may last a while longer.  He worsens he should go to primary care physician or return to our clinic.   Final Clinical Impressions(s) / UC Diagnoses   Final diagnoses:  Bronchitis   Discharge Instructions   None    ED Prescriptions    Medication Sig Dispense Auth. Provider   azithromycin (ZITHROMAX) 250 MG  tablet Take 1 tablet (250 mg total) by mouth  daily. Take first 2 tablets together, then 1 every day until finished. 6 tablet Ovid Curdoemer, Ernst Cumpston P, PA-C   predniSONE (DELTASONE) 20 MG tablet Take 2 tablets (40 mg) daily by mouth 8 tablet Lutricia Feiloemer, Annel Zunker P, PA-C   benzonatate (TESSALON) 200 MG capsule Take one cap TID PRN cough 30 capsule Lutricia Feiloemer, Carin Shipp P, PA-C   chlorpheniramine-HYDROcodone (TUSSIONEX PENNKINETIC ER) 10-8 MG/5ML SUER Take 5 mLs by mouth 2 (two) times daily. 115 mL Lutricia Feiloemer, Drishti Pepperman P, PA-C     Controlled Substance Prescriptions [place] Controlled Substance Registry consulted? Not Applicable   Lutricia FeilRoemer, Peachie Barkalow P, PA-C 11/14/18 1408

## 2018-11-14 NOTE — ED Triage Notes (Signed)
Patient c/o cough x 3 weeks.

## 2019-05-31 DIAGNOSIS — Z20828 Contact with and (suspected) exposure to other viral communicable diseases: Secondary | ICD-10-CM | POA: Diagnosis not present

## 2020-06-09 ENCOUNTER — Encounter: Payer: Self-pay | Admitting: Emergency Medicine

## 2020-06-09 ENCOUNTER — Other Ambulatory Visit: Payer: Self-pay

## 2020-06-09 ENCOUNTER — Ambulatory Visit (INDEPENDENT_AMBULATORY_CARE_PROVIDER_SITE_OTHER): Payer: BC Managed Care – PPO

## 2020-06-09 ENCOUNTER — Ambulatory Visit
Admission: EM | Admit: 2020-06-09 | Discharge: 2020-06-09 | Disposition: A | Discharge status: Home / Self Care | Payer: BC Managed Care – PPO | Attending: Family Medicine | Admitting: Family Medicine

## 2020-06-09 DIAGNOSIS — R05 Cough: Secondary | ICD-10-CM

## 2020-06-09 DIAGNOSIS — J988 Other specified respiratory disorders: Secondary | ICD-10-CM

## 2020-06-09 DIAGNOSIS — Z20822 Contact with and (suspected) exposure to covid-19: Secondary | ICD-10-CM | POA: Insufficient documentation

## 2020-06-09 DIAGNOSIS — R509 Fever, unspecified: Secondary | ICD-10-CM

## 2020-06-09 DIAGNOSIS — J9811 Atelectasis: Secondary | ICD-10-CM | POA: Diagnosis not present

## 2020-06-09 MED ORDER — HYDROCODONE-HOMATROPINE 5-1.5 MG/5ML PO SYRP: 5.0000 mL | ORAL_SOLUTION | Freq: Four times a day (QID) | ORAL | 0 refills | Status: DC | PRN

## 2020-06-09 MED ORDER — IBUPROFEN 800 MG PO TABS
800.0000 mg | ORAL_TABLET | Freq: Once | ORAL | Status: AC
  Administered 2020-06-09: 800 mg via ORAL

## 2020-06-09 MED ORDER — PREDNISONE 50 MG PO TABS: ORAL_TABLET | ORAL | 0 refills | Status: DC

## 2020-06-09 NOTE — ED Triage Notes (Signed)
Patient c/o cough, chest congestion, headaches that started 3 days ago.  Patient denies fevers.

## 2020-06-09 NOTE — ED Provider Notes (Signed)
[facility]    CSN: [id number] Arrival date & time: 06/09/20  1532  History   Chief Complaint Chief Complaint  Patient presents with  . Cough   HPI  34 year old male presents with cough, chest congestion, headaches.  Patient reports that his symptoms started on Tuesday.  He reports cough, headache, chest congestion.  Denies fever at home.  However, he is currently febrile at 101.4.  No reported sick contacts.  Reports that his pain is currently 4/10 in severity.  No relieving factors.  He is particular bothered by the cough which is keeping him up at night.  Patient states that he is taken some NyQuil without resolution.  No other medications interventions tried.  No other complaints.  Home Medications    Prior to Admission medications   Medication Sig Start Date End Date Taking? Authorizing Provider  HYDROcodone-homatropine (HYCODAN) 5-1.5 MG/5ML syrup Take 5 mLs by mouth every 6 (six) hours as needed for cough. 06/09/20   Tommie Sams, DO  predniSONE (DELTASONE) 50 MG tablet 1 tablet daily x 5 days 06/09/20   Tommie Sams, DO    Family History Family History  Problem Relation Age of Onset  . Healthy Mother   . Hypertension Father   . Heart attack Father     Social History Social History   Tobacco Use  . Smoking status: Never Smoker  . Smokeless tobacco: Never Used  Vaping Use  . Vaping Use: Never used  Substance Use Topics  . Alcohol use: Yes    Comment: social  . Drug use: No     Allergies   Patient has no known allergies.   Review of Systems Review of Systems  Respiratory: Positive for cough.   Neurological: Positive for headaches.   Physical Exam Triage Vital Signs ED Triage Vitals  Enc Vitals Group     BP 06/09/20 1558 118/81     Pulse Rate 06/09/20 1558 (!) 110     Resp 06/09/20 1558 16     Temp 06/09/20 1558 (!) 101.4 F (38.6 C)     Temp Source 06/09/20 1558 Oral     SpO2 06/09/20 1558 98 %     Weight 06/09/20 1554 180 lb (81.6  kg)     Height 06/09/20 1554 5\' 7"  (1.702 m)     Head Circumference --      Peak Flow --      Pain Score 06/09/20 1554 4     Pain Loc --      Pain Edu? --      Excl. in GC? --    Updated Vital Signs BP 118/81 (BP Location: Right Arm)   Pulse (!) 110   Temp (!) 101.4 F (38.6 C) (Oral)   Resp 16   Ht 5\' 7"  (1.702 m)   Wt 81.6 kg   SpO2 98%   BMI 28.19 kg/m   Visual Acuity Right Eye Distance:   Left Eye Distance:   Bilateral Distance:    Right Eye Near:   Left Eye Near:    Bilateral Near:     Physical Exam Constitutional:      General: He is not in acute distress.    Appearance: He is not ill-appearing.  HENT:     Head: Normocephalic and atraumatic.  Eyes:     General:        Right eye: No discharge.        Left eye: No discharge.     Conjunctiva/sclera: Conjunctivae  normal.  Cardiovascular:     Rate and Rhythm: Regular rhythm. Tachycardia present.  Pulmonary:     Effort: Pulmonary effort is normal.     Breath sounds: Normal breath sounds. No wheezing or rales.  Neurological:     Mental Status: He is alert.  Psychiatric:        Mood and Affect: Mood normal.        Behavior: Behavior normal.    UC Treatments / Results  Labs (all labs ordered are listed, but only abnormal results are displayed) Labs Reviewed  SARS CORONAVIRUS 2 (TAT 6-24 HRS)    EKG   Radiology DG Chest 2 View  Result Date: 06/09/2020 CLINICAL DATA:  Cough and fever EXAM: CHEST - 2 VIEW COMPARISON:  November 14, 2018 FINDINGS: There is minimal left base atelectasis. Lungs elsewhere are clear. The heart size and pulmonary vascularity are normal. No adenopathy. No bone lesions. IMPRESSION: Minimal left base atelectasis. Lungs elsewhere clear. Cardiac silhouette within normal limits. No adenopathy. Electronically Signed   By: Bretta Bang III M.D.   On: 06/09/2020 16:46    Procedures Procedures (including critical care time)  Medications Ordered in UC Medications  ibuprofen  (ADVIL) tablet 800 mg (800 mg Oral Given 06/09/20 1603)    Initial Impression / Assessment and Plan / UC Course  I have reviewed the triage vital signs and the nursing notes.  Pertinent labs & imaging results that were available during my care of the patient were reviewed by me and considered in my medical decision making (see chart for details).    34 year old male presents with respiratory infection.  Suspected COVID-19.  Chest x-ray obtained today and independently reviewed by me.  Interpretation: No acute findings.  No evidence of pneumonia.  Awaiting test result.  Placing on prednisone and Hycodan.  Final Clinical Impressions(s) / UC Diagnoses   Final diagnoses:  Respiratory infection  Suspected COVID-19 virus infection     Discharge Instructions     Result will be back tomorrow.  Rest.  Stay home.  Medication as prescribed.  Take care  Dr. Adriana Simas    ED Prescriptions    Medication Sig Dispense Auth. Provider   predniSONE (DELTASONE) 50 MG tablet 1 tablet daily x 5 days 5 tablet Loic Hobin G, DO   HYDROcodone-homatropine (HYCODAN) 5-1.5 MG/5ML syrup Take 5 mLs by mouth every 6 (six) hours as needed for cough. 120 mL Tommie Sams, DO     PDMP not reviewed this encounter.   Tommie Sams, [place] 06/09/20 2014

## 2020-06-09 NOTE — Discharge Instructions (Signed)
Result will be back tomorrow.  Rest.  Stay home.  Medication as prescribed.  Take care  Dr. Adriana Simas

## 2020-06-10 DIAGNOSIS — U071 COVID-19: Secondary | ICD-10-CM

## 2020-06-10 HISTORY — DX: COVID-19: U07.1

## 2020-06-10 LAB — SARS CORONAVIRUS 2 (TAT 6-24 HRS): SARS Coronavirus 2: POSITIVE — AB

## 2020-06-11 ENCOUNTER — Other Ambulatory Visit: Payer: Self-pay | Admitting: Adult Health

## 2020-06-11 ENCOUNTER — Ambulatory Visit (HOSPITAL_COMMUNITY): Payer: BC Managed Care – PPO | Attending: Pulmonary Disease

## 2020-06-11 DIAGNOSIS — U071 COVID-19: Secondary | ICD-10-CM

## 2020-06-11 NOTE — Progress Notes (Signed)
I connected by phone with Alex Cruz on 06/11/2020 at 9:00 AM to discuss the potential use of a new treatment for mild to moderate COVID-19 viral infection in non-hospitalized patients.  This patient is a 34 y.o. male that meets the FDA criteria for Emergency Use Authorization of COVID monoclonal antibody casirivimab/imdevimab.  Has a (+) direct SARS-CoV-2 viral test result  Has mild or moderate COVID-19   Is NOT hospitalized due to COVID-19  Is within 10 days of symptom onset  Has at least one of the high risk factor(s) for progression to severe COVID-19 and/or hospitalization as defined in EUA.  Specific high risk criteria : BMI > 25   I have spoken and communicated the following to the patient or parent/caregiver regarding COVID monoclonal antibody treatment:  1. FDA has authorized the emergency use for the treatment of mild to moderate COVID-19 in adults and pediatric patients with positive results of direct SARS-CoV-2 viral testing who are 23 years of age and older weighing at least 40 kg, and who are at high risk for progressing to severe COVID-19 and/or hospitalization.  2. The significant known and potential risks and benefits of COVID monoclonal antibody, and the extent to which such potential risks and benefits are unknown.  3. Information on available alternative treatments and the risks and benefits of those alternatives, including clinical trials.  4. Patients treated with COVID monoclonal antibody should continue to self-isolate and use infection control measures (e.g., wear mask, isolate, social distance, avoid sharing personal items, clean and disinfect high touch surfaces, and frequent handwashing) according to CDC guidelines.   5. The patient or parent/caregiver has the option to accept or refuse COVID monoclonal antibody treatment.  After reviewing this information with the patient, The patient agreed to proceed with receiving casirivimab\imdevimab infusion and  will be provided a copy of the Fact sheet prior to receiving the infusion. [name] 06/11/2020 9:00 AM

## 2020-06-17 ENCOUNTER — Other Ambulatory Visit: Payer: Self-pay

## 2020-06-17 ENCOUNTER — Encounter: Payer: Self-pay | Admitting: Emergency Medicine

## 2020-06-17 ENCOUNTER — Emergency Department: Payer: BC Managed Care – PPO

## 2020-06-17 ENCOUNTER — Ambulatory Visit
Admission: EM | Admit: 2020-06-17 | Discharge: 2020-06-17 | Disposition: A | Discharge status: Home / Self Care | Payer: BC Managed Care – PPO

## 2020-06-17 ENCOUNTER — Inpatient Hospital Stay
Admission: EM | Admit: 2020-06-17 | Discharge: 2020-06-21 | DRG: 177 | Disposition: A | Discharge status: Home / Self Care | Payer: BC Managed Care – PPO | Attending: Internal Medicine | Admitting: Internal Medicine

## 2020-06-17 DIAGNOSIS — J9600 Acute respiratory failure, unspecified whether with hypoxia or hypercapnia: Secondary | ICD-10-CM | POA: Diagnosis not present

## 2020-06-17 DIAGNOSIS — J9601 Acute respiratory failure with hypoxia: Secondary | ICD-10-CM | POA: Diagnosis not present

## 2020-06-17 DIAGNOSIS — R197 Diarrhea, unspecified: Secondary | ICD-10-CM | POA: Diagnosis not present

## 2020-06-17 DIAGNOSIS — R918 Other nonspecific abnormal finding of lung field: Secondary | ICD-10-CM | POA: Diagnosis not present

## 2020-06-17 DIAGNOSIS — Z79891 Long term (current) use of opiate analgesic: Secondary | ICD-10-CM

## 2020-06-17 DIAGNOSIS — U071 COVID-19: Principal | ICD-10-CM

## 2020-06-17 DIAGNOSIS — R7301 Impaired fasting glucose: Secondary | ICD-10-CM

## 2020-06-17 DIAGNOSIS — A4189 Other specified sepsis: Secondary | ICD-10-CM | POA: Diagnosis not present

## 2020-06-17 DIAGNOSIS — R439 Unspecified disturbances of smell and taste: Secondary | ICD-10-CM | POA: Diagnosis present

## 2020-06-17 DIAGNOSIS — Z7952 Long term (current) use of systemic steroids: Secondary | ICD-10-CM | POA: Diagnosis not present

## 2020-06-17 DIAGNOSIS — Z8249 Family history of ischemic heart disease and other diseases of the circulatory system: Secondary | ICD-10-CM

## 2020-06-17 DIAGNOSIS — E871 Hypo-osmolality and hyponatremia: Secondary | ICD-10-CM

## 2020-06-17 DIAGNOSIS — R0602 Shortness of breath: Secondary | ICD-10-CM | POA: Diagnosis not present

## 2020-06-17 DIAGNOSIS — A419 Sepsis, unspecified organism: Secondary | ICD-10-CM

## 2020-06-17 DIAGNOSIS — R7989 Other specified abnormal findings of blood chemistry: Secondary | ICD-10-CM

## 2020-06-17 DIAGNOSIS — E861 Hypovolemia: Secondary | ICD-10-CM | POA: Diagnosis not present

## 2020-06-17 DIAGNOSIS — J1282 Pneumonia due to coronavirus disease 2019: Secondary | ICD-10-CM | POA: Diagnosis present

## 2020-06-17 DIAGNOSIS — T380X5A Adverse effect of glucocorticoids and synthetic analogues, initial encounter: Secondary | ICD-10-CM | POA: Diagnosis present

## 2020-06-17 DIAGNOSIS — R6520 Severe sepsis without septic shock: Secondary | ICD-10-CM

## 2020-06-17 DIAGNOSIS — J189 Pneumonia, unspecified organism: Secondary | ICD-10-CM | POA: Diagnosis not present

## 2020-06-17 DIAGNOSIS — R509 Fever, unspecified: Secondary | ICD-10-CM | POA: Diagnosis not present

## 2020-06-17 DIAGNOSIS — F17200 Nicotine dependence, unspecified, uncomplicated: Secondary | ICD-10-CM | POA: Diagnosis not present

## 2020-06-17 DIAGNOSIS — R6510 Systemic inflammatory response syndrome (SIRS) of non-infectious origin without acute organ dysfunction: Secondary | ICD-10-CM

## 2020-06-17 LAB — CBC WITH DIFFERENTIAL/PLATELET
Abs Immature Granulocytes: 0.02 10*3/uL (ref 0.00–0.07)
Basophils Absolute: 0 10*3/uL (ref 0.0–0.1)
Basophils Relative: 0 %
Eosinophils Absolute: 0 10*3/uL (ref 0.0–0.5)
Eosinophils Relative: 0 %
HCT: 50.2 % (ref 39.0–52.0)
Hemoglobin: 18 g/dL — ABNORMAL HIGH (ref 13.0–17.0)
Immature Granulocytes: 0 %
Lymphocytes Relative: 14 %
Lymphs Abs: 0.7 10*3/uL (ref 0.7–4.0)
MCH: 31.1 pg (ref 26.0–34.0)
MCHC: 35.9 g/dL (ref 30.0–36.0)
MCV: 86.9 fL (ref 80.0–100.0)
Monocytes Absolute: 0.2 10*3/uL (ref 0.1–1.0)
Monocytes Relative: 4 %
Neutro Abs: 4.1 10*3/uL (ref 1.7–7.7)
Neutrophils Relative %: 82 %
Platelets: 163 10*3/uL (ref 150–400)
RBC: 5.78 MIL/uL (ref 4.22–5.81)
RDW: 12 % (ref 11.5–15.5)
WBC: 5.1 10*3/uL (ref 4.0–10.5)
nRBC: 0 % (ref 0.0–0.2)

## 2020-06-17 LAB — COMPREHENSIVE METABOLIC PANEL
ALT: 36 U/L (ref 0–44)
AST: 58 U/L — ABNORMAL HIGH (ref 15–41)
Albumin: 3.8 g/dL (ref 3.5–5.0)
Alkaline Phosphatase: 50 U/L (ref 38–126)
Anion gap: 13 (ref 5–15)
BUN: 15 mg/dL (ref 6–20)
CO2: 22 mmol/L (ref 22–32)
Calcium: 8.5 mg/dL — ABNORMAL LOW (ref 8.9–10.3)
Chloride: 94 mmol/L — ABNORMAL LOW (ref 98–111)
Creatinine, Ser: 0.9 mg/dL (ref 0.61–1.24)
GFR calc Af Amer: >60 mL/min (ref >60)
GFR calc non Af Amer: >60 mL/min (ref >60)
Glucose, Bld: 105 mg/dL — ABNORMAL HIGH (ref 70–99)
Potassium: 3.7 mmol/L (ref 3.5–5.1)
Sodium: 129 mmol/L — ABNORMAL LOW (ref 135–145)
Total Bilirubin: 0.9 mg/dL (ref 0.3–1.2)
Total Protein: 7.7 g/dL (ref 6.5–8.1)

## 2020-06-17 LAB — LACTIC ACID, PLASMA
Lactic Acid, Venous: 1.3 mmol/L (ref 0.5–1.9)
Lactic Acid, Venous: 0.9 mmol/L (ref 0.5–1.9)

## 2020-06-17 LAB — PROTIME-INR
INR: 1 (ref 0.8–1.2)
Prothrombin Time: 12.4 seconds (ref 11.4–15.2)

## 2020-06-17 LAB — ABO/RH: ABO/RH(D): O POS

## 2020-06-17 LAB — PROCALCITONIN: Procalcitonin: 0.34 ng/mL

## 2020-06-17 LAB — TROPONIN I (HIGH SENSITIVITY)
Troponin I (High Sensitivity): 7 ng/L (ref <18)
Troponin I (High Sensitivity): 4 ng/L (ref <18)

## 2020-06-17 MED ORDER — SODIUM CHLORIDE 0.9 % IV BOLUS
500.0000 mL | Freq: Once | INTRAVENOUS | Status: AC
  Administered 2020-06-17: 500 mL via INTRAVENOUS

## 2020-06-17 MED ORDER — SODIUM CHLORIDE 0.9 % IV SOLN: INTRAVENOUS | Status: DC

## 2020-06-17 MED ORDER — SODIUM CHLORIDE 0.9 % IV SOLN
200.0000 mg | Freq: Once | INTRAVENOUS | Status: AC
  Administered 2020-06-17: 200 mg via INTRAVENOUS
  Filled 2020-06-17: qty 200

## 2020-06-17 MED ORDER — ALBUTEROL SULFATE HFA 108 (90 BASE) MCG/ACT IN AERS
2.0000 | INHALATION_SPRAY | Freq: Once | RESPIRATORY_TRACT | Status: AC
  Administered 2020-06-17: 2 via RESPIRATORY_TRACT
  Filled 2020-06-17: qty 6.7

## 2020-06-17 MED ORDER — SODIUM CHLORIDE 0.9 % IV SOLN
100.0000 mg | Freq: Every day | INTRAVENOUS | Status: AC
  Administered 2020-06-18 – 2020-06-21 (×4): 100 mg via INTRAVENOUS
  Filled 2020-06-17 (×5): qty 20

## 2020-06-17 MED ORDER — DEXAMETHASONE SODIUM PHOSPHATE 10 MG/ML IJ SOLN: 6.0000 mg | INTRAMUSCULAR | Status: DC

## 2020-06-17 MED ORDER — ENOXAPARIN SODIUM 40 MG/0.4ML ~~LOC~~ SOLN
40.0000 mg | SUBCUTANEOUS | Status: DC
  Administered 2020-06-18 – 2020-06-20 (×4): 40 mg via SUBCUTANEOUS
  Filled 2020-06-17 (×4): qty 0.4

## 2020-06-17 MED ORDER — DEXAMETHASONE SODIUM PHOSPHATE 10 MG/ML IJ SOLN
10.0000 mg | Freq: Once | INTRAMUSCULAR | Status: AC
  Administered 2020-06-17: 10 mg via INTRAVENOUS
  Filled 2020-06-17: qty 1

## 2020-06-17 MED ORDER — ACETAMINOPHEN 500 MG PO TABS
1000.0000 mg | ORAL_TABLET | Freq: Once | ORAL | Status: AC
  Administered 2020-06-17: 1000 mg via ORAL
  Filled 2020-06-17: qty 2

## 2020-06-17 MED ORDER — ACETAMINOPHEN 325 MG PO TABS: 650.0000 mg | ORAL_TABLET | Freq: Four times a day (QID) | ORAL | Status: DC | PRN

## 2020-06-17 NOTE — Progress Notes (Signed)
Remdesivir - Pharmacy Brief Note   O:  ALT: 36 CXR:  SpO2: 92 % on O2 - Nasal Canula   A/P:  Pt tested positive for COVID 8 days ago 8/20.  Remdesivir 200 mg IVPB once followed by 100 mg IVPB daily x 4 days.   Medardo Hassing D 06/17/2020 7:06 PM

## 2020-06-17 NOTE — ED Provider Notes (Signed)
[facility] Emergency Department Provider Note   ____________________________________________   First MD Initiated Contact with Patient 06/17/20 1656     (approximate)  I have reviewed the triage vital signs and the nursing notes.   HISTORY  Chief Complaint Shortness of Breath (COVID Positive)    HPI Alex Cruz is a 34 y.o. male with no significant past medical history who presents to the ED complaining of shortness of breath.  Patient reports that he initially developed cough, congestion, fevers, and generalized weakness 9 days ago.  He subsequently tested positive for COVID-19 8 days ago.  He has been feeling increasingly bad since then with worsening shortness of breath and fever at home of 102.5.  He feels short of breath both at rest and with activity.  He endorses chest pain that is sharp when he coughs.  He was initially evaluated at urgent care and referred to the ED for further evaluation.  He has not been vaccinated for COVID-19.        Past Medical History:  Diagnosis Date  . COVID-19 06/10/2020  . No known health problems     There are no problems to display for this patient.   No past surgical history on file.  Prior to Admission medications   Medication Sig Start Date End Date Taking? Authorizing Provider  HYDROcodone-homatropine (HYCODAN) 5-1.5 MG/5ML syrup Take 5 mLs by mouth every 6 (six) hours as needed for cough. 06/09/20  Yes Cook, Jayce G, DO  predniSONE (DELTASONE) 50 MG tablet 1 tablet daily x 5 days 06/09/20  Yes Everlene Other G, DO    Allergies Patient has no known allergies.  Family History  Problem Relation Age of Onset  . Healthy Mother   . Hypertension Father   . Heart attack Father     Social History Social History   Tobacco Use  . Smoking status: Never Smoker  . Smokeless tobacco: Never Used  Vaping Use  . Vaping Use: Never used  Substance Use Topics  . Alcohol use: Yes    Comment: social  . Drug  use: No    Review of Systems  Constitutional: Positive for fever/chills Eyes: No visual changes. ENT: No sore throat. Cardiovascular: Positive for chest pain. Respiratory: Positive for cough and shortness of breath. Gastrointestinal: No abdominal pain.  No nausea, no vomiting.  No diarrhea.  No constipation. Genitourinary: Negative for dysuria. Musculoskeletal: Negative for back pain. Skin: Negative for rash. Neurological: Negative for headaches, focal weakness or numbness.  ____________________________________________   PHYSICAL EXAM:  VITAL SIGNS: ED Triage Vitals  Enc Vitals Group     BP 06/17/20 1625 130/77     Pulse Rate 06/17/20 1625 (!) 118     Resp 06/17/20 1625 (!) 30     Temp 06/17/20 1625 (!) 103.3 F (39.6 C)     Temp Source 06/17/20 1625 Oral     SpO2 06/17/20 1625 94 %     Weight 06/17/20 1621 180 lb (81.6 kg)     Height 06/17/20 1621 5\' 7"  (1.702 m)     Head Circumference --      Peak Flow --      Pain Score 06/17/20 1621 8     Pain Loc --      Pain Edu? --      Excl. in GC? --     Constitutional: Alert and oriented. Eyes: Conjunctivae are normal. Head: Atraumatic. Nose: No congestion/rhinnorhea. Mouth/Throat: Mucous membranes are moist. Neck: Normal ROM Cardiovascular:  Tachycardic, regular rhythm. Grossly normal heart sounds. Respiratory: Tachypneic with increased respiratory effort.  No retractions. Lungs CTAB. Gastrointestinal: Soft and nontender. No distention. Genitourinary: deferred Musculoskeletal: No lower extremity tenderness nor edema. Neurologic:  Normal speech and language. No gross focal neurologic deficits are appreciated. Skin:  Skin is warm, dry and intact. No rash noted. Psychiatric: Mood and affect are normal. Speech and behavior are normal.  ____________________________________________   LABS (all labs ordered are listed, but only abnormal results are displayed)  Labs Reviewed  COMPREHENSIVE METABOLIC PANEL - Abnormal;  Notable for the following components:      Result Value   Sodium 129 (*)    Chloride 94 (*)    Glucose, Bld 105 (*)    Calcium 8.5 (*)    AST 58 (*)    All other components within normal limits  CBC WITH DIFFERENTIAL/PLATELET - Abnormal; Notable for the following components:   Hemoglobin 18.0 (*)    All other components within normal limits  CULTURE, BLOOD (ROUTINE X 2)  CULTURE, BLOOD (ROUTINE X 2)  LACTIC ACID, PLASMA  PROTIME-INR  PROCALCITONIN  LACTIC ACID, PLASMA  URINALYSIS, COMPLETE (UACMP) WITH MICROSCOPIC  PROCALCITONIN  TROPONIN I (HIGH SENSITIVITY)   ____________________________________________  EKG  ED ECG REPORT I, Chesley Noon, the attending physician, personally viewed and interpreted this ECG.   Date: 06/17/2020  EKG Time: 17:56  Rate: 109  Rhythm: sinus tachycardia  Axis: Normal  Intervals:none  ST&T Change: None   PROCEDURES  Procedure(s) performed (including Critical Care):  Procedures   ____________________________________________   INITIAL IMPRESSION / ASSESSMENT AND PLAN / ED COURSE       34 year old male with no significant past medical history who was diagnosed with COVID-19 8 days ago presents to the ED with increasing fevers, worsening shortness of breath, and pleuritic pain in his chest.  He is tachypneic with increased work of breathing, but maintaining O2 sats of around 92% on my assessment.  Chest x-ray appears consistent with viral pneumonia and we will hold off on antibiotics for now, but screen blood cultures, lactate, and procalcitonin.  We will treat symptomatically with albuterol and have patient ambulate on a pulse ox.  Lab work thus far reassuring, procalcitonin mildly elevated but I doubt bacterial illness at this time.  Patient did drop his O2 sats to 86% when ambulating on room air, we will treat with Decadron and case discussed with hospitalist for admission.       ____________________________________________   FINAL CLINICAL IMPRESSION(S) / ED DIAGNOSES  Final diagnoses:  Pneumonia due to COVID-19 virus  SOB (shortness of breath)     ED Discharge Orders    None       Note:  This document was prepared using Dragon voice recognition software and may include unintentional dictation errors.   Chesley Noon, MD 06/17/20 [place]

## 2020-06-17 NOTE — Discharge Instructions (Addendum)
You need to go to the emergency department to be evaluated You will need to be admitted to the hospital for further management.

## 2020-06-17 NOTE — ED Notes (Signed)
RN ambulated patient around room. The lowest patient O2 saturation dropped was 89 for a brief moment and stayed in the low 90's. Patient did have a coughing fit after ambulating and O2 Saturation dropped down to 87% on room air but did quickly come back up to 90% and stayed at 90% before putting on supplemental O2 at 3L via Nasal Canula. Patient did appear to have dyspnea upon exertion with respirations being 33 at one point during ambulation.

## 2020-06-17 NOTE — ED Notes (Signed)
Jae Dire RN and Art therapist aware of patient placed in room.

## 2020-06-17 NOTE — ED Triage Notes (Signed)
Patient in today w/ SOB, fever of 102.5, rapid pulse, and cough. Patient tested positive for COVID 06/09/2020.

## 2020-06-17 NOTE — H&P (Addendum)
History and Physical    KYON BENTLER HLK:[id number] DOB: 1985-11-11 DOA: 06/17/2020  PCP: Patient, No Pcp Per  Patient coming from: Home  I have personally briefly reviewed patient's old medical records in [facility]  Chief Complaint: Increasing shortness of breath  HPI: Alex Cruz is a 34 y.o. male with medical history significant for recent Covid infection who presents with concerns of increasing shortness of breath.  Patient was tested positive for Covid on 8/20 with symptoms starting the day prior to that.  He has noticed for the past 2 days he has had increasing shortness of breath both at rest and with exertion.  Also has worsening cough associated with the shortness of breath.  Has had low appetite for the past couple of days and has diarrhea daily at least for the past 4 days.  Has some diffuse abdominal pain prior to his diarrhea.  Denies any nausea or vomiting.  Unknown where he could have gotten Covid but reportedly is not vaccinated.  He presented to urgent care with these symptoms today and was found to have oxygen saturation of 94% and was told to present to the ED.  He denies tobacco, alcohol illicit drug use.  ED Course: He was tachycardic and tachypneic and had hypoxia down to 86% with ambulation and was placed on 3 L via nasal cannula. Lab work is most notable for hyponatremia with sodium 129.  Chest x-ray is consistent with multifocal pneumonia from Covid.  Review of Systems:  Constitutional: No Weight Change, No Fever ENT/Mouth: No sore throat, No Rhinorrhea Eyes: No Eye Pain, No Vision Changes Cardiovascular: No Chest Pain, + SOB, + Dyspnea on Exertion Respiratory: + Cough, No Sputum, No Wheezing, no Dyspnea  Gastrointestinal: No Nausea, No Vomiting, + Diarrhea, No Constipation, No Pain Genitourinary: no Urinary Incontinence Musculoskeletal: No Arthralgias, No Myalgias Skin: No Skin Lesions, No Pruritus, Neuro: no Weakness, No Numbness Psych: No  Anxiety/Panic, No Depression, no decrease appetite Heme/Lymph: No Bruising, No Bleeding   Past Medical History:  Diagnosis Date   COVID-19 06/10/2020   No known health problems     Surgical history None   reports that he has never smoked. He has never used smokeless tobacco. He reports current alcohol use. He reports that he does not use drugs. Social History  No Known Allergies  Family history Father-hypertension, hyperlipidemia Older brother-hyperlipidemia   Prior to Admission medications   Medication Sig Start Date End Date Taking? Authorizing Provider  HYDROcodone-homatropine (HYCODAN) 5-1.5 MG/5ML syrup Take 5 mLs by mouth every 6 (six) hours as needed for cough. 06/09/20  Yes Cook, Verdis Frederickson, DO  predniSONE (DELTASONE) 50 MG tablet 1 tablet daily x 5 days 06/09/20  Yes Tommie Sams, [place]    Physical Exam: Vitals:   06/17/20 1752 06/17/20 1757 06/17/20 1800 06/17/20 1900  BP:   128/87 123/77  Pulse:  (!) 112 (!) 106 (!) 107  Resp:  (!) 26 (!) 21 (!) 25  Temp:      TempSrc:      SpO2: 92% (S) 94% 93% 95%  Weight:      Height:        Constitutional: NAD, calm, comfortable, mild ill-appearing and diaphoretic appearing young male laying at 30 degree incline in bed Vitals:   06/17/20 1752 06/17/20 1757 06/17/20 1800 06/17/20 1900  BP:   128/87 123/77  Pulse:  (!) 112 (!) 106 (!) 107  Resp:  (!) 26 (!) 21 (!) 25  Temp:  TempSrc:      SpO2: 92% (S) 94% 93% 95%  Weight:      Height:       Eyes: PERRL, lids and conjunctivae normal ENMT: Mucous membranes are moist.  Neck: normal, supple,  Respiratory: Bibasilar crackles but no wheezing.  Some increased work of breathing at rest on 3 L of O2 via nasal cannula.   No accessory muscle use.  Cardiovascular: Sinus tachycardia, no murmurs / rubs / gallops. No extremity edema.  Abdomen: no tenderness, no masses palpated.  Bowel sounds positive.  Musculoskeletal: no clubbing / cyanosis. No joint deformity upper and  lower extremities. Good ROM, no contractures. Normal muscle tone.  Skin: no rashes, lesions, ulcers. No induration Neurologic: CN 2-12 grossly intact. Sensation intact, Strength 5/5 in all 4.  Psychiatric: Normal judgment and insight. Alert and oriented x 3. Normal mood.     Labs on Admission: I have personally reviewed following labs and imaging studies  CBC: Recent Labs  Lab 06/17/20 1627  WBC 5.1  NEUTROABS 4.1  HGB 18.0*  HCT 50.2  MCV 86.9  PLT 163   Basic Metabolic Panel: Recent Labs  Lab 06/17/20 1627  NA 129*  K 3.7  CL 94*  CO2 22  GLUCOSE 105*  BUN 15  CREATININE 0.90  CALCIUM 8.5*   GFR: Estimated Creatinine Clearance: 119.4 mL/min (by C-G formula based on SCr of 0.9 mg/dL). Liver Function Tests: Recent Labs  Lab 06/17/20 1627  AST 58*  ALT 36  ALKPHOS 50  BILITOT 0.9  PROT 7.7  ALBUMIN 3.8   No results for input(s): LIPASE, AMYLASE in the last 168 hours. No results for input(s): AMMONIA in the last 168 hours. Coagulation Profile: Recent Labs  Lab 06/17/20 1627  INR 1.0   Cardiac Enzymes: No results for input(s): CKTOTAL, CKMB, CKMBINDEX, TROPONINI in the last 168 hours. BNP (last 3 results) No results for input(s): PROBNP in the last 8760 hours. HbA1C: No results for input(s): HGBA1C in the last 72 hours. CBG: No results for input(s): GLUCAP in the last 168 hours. Lipid Profile: No results for input(s): CHOL, HDL, LDLCALC, TRIG, CHOLHDL, LDLDIRECT in the last 72 hours. Thyroid Function Tests: No results for input(s): TSH, T4TOTAL, FREET4, T3FREE, THYROIDAB in the last 72 hours. Anemia Panel: No results for input(s): VITAMINB12, FOLATE, FERRITIN, TIBC, IRON, RETICCTPCT in the last 72 hours. Urine analysis:    Component Value Date/Time   COLORURINE YELLOW (A) 08/27/2016 0845   APPEARANCEUR CLEAR (A) 08/27/2016 0845   LABSPEC 1.025 08/27/2016 0845   PHURINE 6.0 08/27/2016 0845   GLUCOSEU NEGATIVE 08/27/2016 0845   HGBUR NEGATIVE  08/27/2016 0845   BILIRUBINUR NEGATIVE 08/27/2016 0845   KETONESUR NEGATIVE 08/27/2016 0845   PROTEINUR NEGATIVE 08/27/2016 0845   NITRITE NEGATIVE 08/27/2016 0845   LEUKOCYTESUR NEGATIVE 08/27/2016 0845    Radiological Exams on Admission: DG Chest Portable 1 View  Result Date: 06/17/2020 CLINICAL DATA:  Patient states SOB, fever of 102.5, rapid pulse, and cough starting today. Patient tested positive for COVID 06/09/2020. No hx of pneumothorax conditions or surgery. Non smoker. EXAM: PORTABLE CHEST 1 VIEW COMPARISON:  Chest radiograph 06/09/2020 FINDINGS: Stable cardiomediastinal contours. There are diffuse bilateral infiltrates. No pneumothorax or large pleural effusion. No acute finding in the visualized skeleton. IMPRESSION: Diffuse bilateral infiltrates most likely representing multifocal pneumonia. Electronically Signed   By: Emmaline Kluver M.D.   On: 06/17/2020 17:09      Assessment/Plan  Acute hypoxic respiratory failure secondary to COVID pneumonia  Admitted On 3L  Maintain O2 > 92%  IV Decadron  Remdesivir Monitor inflammatory markers  Sepsis secondary to Covid infection Patient presented tachycardic, tachypneic and febrile due to COVID pneumonia Continuous IV fluids   Hyponatremia due to hypovolemia Give 500 cc normal saline bolus and IV continuous 100 cc/h fluid Repeat BMP in the morning  DVT prophylaxis:.Lovenox Code Status: Full Family Communication: Plan discussed with patient at bedside  disposition Plan: Home with at least 2 midnight stays  Consults called:  Admission status: inpatient  Status is: Inpatient  Remains inpatient appropriate because:Inpatient level of care appropriate due to severity of illness   Dispo: The patient is from: Home              Anticipated d/c is to: Home              Anticipated d/c date is: 3 days              Patient currently is not medically stable to d/c.         Anselm Jungling DO [facility]   If  7PM-7AM, please contact night-coverage [email or web address]   06/17/2020, 7:14 PM

## 2020-06-17 NOTE — ED Notes (Signed)
Pt states coming in for SOB. Pt is on 2LPM via [place] of oxygen. Another RN attempted to call report up on pt and floor stated they are not taking pt at this time.

## 2020-06-17 NOTE — ED Triage Notes (Signed)
Pt arrived via POV with c/o SOB, dx with covid 8/21, pt tachycardic and tachypneic on arrival, temp 103.3  Was from [facility], had fever there, no meds given for fever.

## 2020-06-17 NOTE — ED Provider Notes (Addendum)
[facility]    CSN: [id number] Arrival date & time: 06/17/20  1457      History   Chief Complaint Chief Complaint  Patient presents with  . Shortness of Breath    Patient is COVID +  . Fever    HPI Alex Cruz is a 34 y.o. male who was diagnosed with COVID-19 infection on 06/09/2020 comes to the urgent care today with worsening shortness of breath, fever of 102.5 Fahrenheit nonproductive cough and palpitations.  Patient says his symptoms have been worsening.  Shortness of breath is both at rest and with minimal activity.  Asked how far he could walk without getting short of breath he says not very far.  He has not been vaccinated against COVID-19 virus.Marland Kitchen   HPI  Past Medical History:  Diagnosis Date  . No known health problems     There are no problems to display for this patient.   History reviewed. No pertinent surgical history.     Home Medications    Prior to Admission medications   Medication Sig Start Date End Date Taking? Authorizing Provider  HYDROcodone-homatropine (HYCODAN) 5-1.5 MG/5ML syrup Take 5 mLs by mouth every 6 (six) hours as needed for cough. 06/09/20   Tommie Sams, DO  predniSONE (DELTASONE) 50 MG tablet 1 tablet daily x 5 days 06/09/20   Tommie Sams, DO    Family History Family History  Problem Relation Age of Onset  . Healthy Mother   . Hypertension Father   . Heart attack Father     Social History Social History   Tobacco Use  . Smoking status: Never Smoker  . Smokeless tobacco: Never Used  Vaping Use  . Vaping Use: Never used  Substance Use Topics  . Alcohol use: Yes    Comment: social  . Drug use: No     Allergies   Patient has no known allergies.   Review of Systems Review of Systems  Constitutional: Positive for activity change, chills, fatigue and fever.  HENT: Positive for congestion.   Respiratory: Positive for cough, chest tightness, shortness of breath and wheezing.   Cardiovascular: Positive  for chest pain and palpitations.  Gastrointestinal: Negative for diarrhea, nausea and vomiting.  Musculoskeletal: Positive for myalgias.  Neurological: Negative for dizziness, light-headedness and headaches.  Psychiatric/Behavioral: Negative for confusion.     Physical Exam Triage Vital Signs ED Triage Vitals  Enc Vitals Group     BP 06/17/20 1519 113/67     Pulse Rate 06/17/20 1519 (!) 117     Resp 06/17/20 1519 (!) 24     Temp 06/17/20 1519 (!) 102.5 F (39.2 C)     Temp Source 06/17/20 1519 Oral     SpO2 06/17/20 1519 95 %     Weight 06/17/20 1520 180 lb (81.6 kg)     Height 06/17/20 1520 5\' 7"  (1.702 m)     Head Circumference --      Peak Flow --      Pain Score 06/17/20 1519 8     Pain Loc --      Pain Edu? --      Excl. in GC? --    No data found.  Updated Vital Signs BP 113/67 (BP Location: Left Arm)   Pulse (!) 117   Temp (!) 102.5 F (39.2 C) (Oral)   Resp (!) 24   Ht 5\' 7"  (1.702 m)   Wt 81.6 kg   SpO2 95%   BMI 28.19  kg/m   Visual Acuity Right Eye Distance:   Left Eye Distance:   Bilateral Distance:    Right Eye Near:   Left Eye Near:    Bilateral Near:     Physical Exam Vitals and nursing note reviewed.  Constitutional:      General: He is in acute distress.     Appearance: He is ill-appearing and toxic-appearing. He is not diaphoretic.  Cardiovascular:     Rate and Rhythm: Tachycardia present.  No extrasystoles are present.    Pulses: No decreased pulses.     Heart sounds: No murmur heard.  No friction rub.  Pulmonary:     Breath sounds: Examination of the right-middle field reveals decreased breath sounds and rhonchi. Examination of the left-middle field reveals decreased breath sounds and rhonchi. Examination of the right-lower field reveals decreased breath sounds, rhonchi and rales. Examination of the left-lower field reveals decreased breath sounds, rhonchi and rales. Decreased breath sounds, rhonchi and rales present. No wheezing.    Chest:     Chest wall: No mass or tenderness.  Abdominal:     Palpations: Abdomen is soft. There is no hepatomegaly or splenomegaly.  Skin:    General: Skin is warm.  Neurological:     General: No focal deficit present.     Mental Status: He is alert and oriented to person, place, and time.      UC Treatments / Results  Labs (all labs ordered are listed, but only abnormal results are displayed) Labs Reviewed - No data to display  EKG   Radiology No results found.  Procedures Procedures (including critical care time)  Medications Ordered in UC Medications - No data to display  Initial Impression / Assessment and Plan / UC Course  I have reviewed the triage vital signs and the nursing notes.  Pertinent labs & imaging results that were available during my care of the patient were reviewed by me and considered in my medical decision making (see chart for details).     1.  Acute respiratory distress secondary to COVID-19 infection: Patient will need further evaluation and in-hospital care for his symptoms Currently his pulse oximetry is 95%, he is tachypneic with respiratory rate of 24 and his pulse is fluctuating between 110 and 120.  Blood pressure is normal at this time.  I offered to transport the patient to the emergency department via EMS but he refused that and he would like to drive himself to the emergency department. Patient will need hospitalization for further management. Final Clinical Impressions(s) / UC Diagnoses   Final diagnoses:  COVID-19 virus infection  SIRS (systemic inflammatory response syndrome) (HCC)     Discharge Instructions     You need to go to the emergency department to be evaluated You will need to be admitted to the hospital for further management.   ED Prescriptions    None     PDMP not reviewed this encounter.   Merrilee Jansky, MD 06/17/20 1540    Merrilee Jansky, MD 06/17/20 [phone]

## 2020-06-18 ENCOUNTER — Encounter: Payer: Self-pay | Admitting: Family Medicine

## 2020-06-18 LAB — FIBRIN DERIVATIVES D-DIMER (ARMC ONLY): Fibrin derivatives D-dimer (ARMC): 1097.24 ng/mL (FEU) — ABNORMAL HIGH (ref 0.00–499.00)

## 2020-06-18 LAB — PROCALCITONIN: Procalcitonin: 0.16 ng/mL

## 2020-06-18 LAB — HIV ANTIBODY (ROUTINE TESTING W REFLEX): HIV Screen 4th Generation wRfx: NONREACTIVE

## 2020-06-18 LAB — C-REACTIVE PROTEIN: CRP: <0.5 mg/dL (ref <1.0)

## 2020-06-18 MED ORDER — METHYLPREDNISOLONE SODIUM SUCC 125 MG IJ SOLR
60.0000 mg | Freq: Two times a day (BID) | INTRAMUSCULAR | Status: DC
  Administered 2020-06-18 – 2020-06-21 (×6): 60 mg via INTRAVENOUS
  Filled 2020-06-18 (×6): qty 2

## 2020-06-18 NOTE — ED Notes (Signed)
Meal tray brought to pt room. Pt asleep.

## 2020-06-18 NOTE — ED Notes (Signed)
Pt asleep, IV patent, VS taken. Pt receiving 4L/min O2 per [place].

## 2020-06-18 NOTE — ED Notes (Signed)
Pt given graham crackers and water at this time.  

## 2020-06-18 NOTE — Progress Notes (Signed)
[facility] Progress Note  Patient: Alex Cruz    TDD:[id number]  DOA: 06/17/2020     Date of Service: the patient was seen and examined on 06/18/2020  Brief hospital course: No past medical history presents with complaints of cough and shortness of breath.  Tested positive for COVID-19 on 06/09/2020.  Unvaccinated. Currently plan is continue current care.  Assessment and Plan: 1.  Acute hypoxic respiratory failure POA Acute COVID-19 Viral Pneumonia CXR: hazy bilateral peripheral opacities Tmax last 24 hours:  Temp (24hrs), Avg:100.4 F (38 C), Min:97.9 F (36.6 C), Max:103.3 F (39.6 C)  Oxygen requirements: On 4 LPM CRP: negative Remdesivir: Started on 06/17/2020. Steroids: Started on 06/17/2020.  Changing Decadron to Solu-Medrol. Baricitinib/Actemra(off-label use): Discussed with the patient.  Currently agreeable to use this medicine if indicated. Patient does not have any past medical history of intestinal rupture or perforation or infection, currently not on any chemotherapy or immunosuppressive therapy. The investigational nature of this medication was discussed with the patient/HCPOA and they choose to proceed as the potential benefits are felt to outweigh risks at this time.  Antibiotics:none Vitamin C and Zinc: continue  DVT Prophylaxis: enoxaparin (LOVENOX) injection 40 mg Start: 06/17/20 2300  Prone positioning: Patient encouraged to stay in prone position as much as possible.  The treatment plan and use of medications and known side effects were discussed with patient/family. It was clearly explained that Complete risks and long-term side effects are unknown, however in the best clinical judgment they seem to be of some clinical benefit rather than medical risks. Patient/family agree with the treatment plan and want to receive these treatments as indicated.  2.  Hyponatremia from hypovolemia Due to poor p.o. intake. Continue monitoring.  3.   Diarrhea. Secondary to COVID-19. Present before admission. Currently no BM. Monitor.  4.  Anosmia. Secondary to Covid.  Monitor.  Responsible for patient's poor p.o. intake.  Diet: Regular diet DVT Prophylaxis:   enoxaparin (LOVENOX) injection 40 mg Start: 06/17/20 2300   Advance goals of care discussion: Full code  Family Communication: no family was present at bedside, at the time of interview.   Disposition:  Status is: Inpatient  Remains inpatient appropriate because:IV treatments appropriate due to intensity of illness or inability to take PO  Dispo: The patient is from: Home              Anticipated d/c is to: Home              Anticipated d/c date is: > 3 days              Patient currently is not medically stable to d/c.  Subjective: Continues to have shortness of breath.  Also has cough.  Reports nausea.  Reports loss of sense of smell.  Physical Exam:  General: Appear in moderate distress, no Rash; Oral Mucosa Clear, moist. no Abnormal Neck Mass Or lumps, Conjunctiva normal  Cardiovascular: S1 and S2 Present, no Murmur, Respiratory: good respiratory effort, Bilateral Air entry present and bilateral  Crackles, Occasional  wheezes Abdomen: Bowel Sound present, Soft and no tenderness Extremities: no Pedal edema, no calf tenderness Neurology: alert and oriented to time, place, and person affect appropriate. no new focal deficit Gait not checked due to patient safety concerns  Vitals:   06/18/20 1300 06/18/20 1330 06/18/20 1400 06/18/20 1430  BP: 125/87 127/80 126/78 126/74  Pulse: 89 89 90 90  Resp: (!) 30 (!) 25 (!) 21 (!) 29  Temp:  TempSrc:      SpO2: 94% 95% 90% 96%  Weight:      Height:        Intake/Output Summary (Last 24 hours) at 06/18/2020 1612 Last data filed at 06/18/2020 1400 Gross per 24 hour  Intake 1100 ml  Output 850 ml  Net 250 ml   Filed Weights   06/17/20 1621  Weight: 81.6 kg    Data Reviewed: I have personally reviewed and  interpreted daily labs, tele strips, imagings as discussed above. I reviewed all nursing notes, pharmacy notes, vitals, pertinent old records I have discussed plan of care as described above with RN and patient/family.  CBC: Recent Labs  Lab 06/17/20 1627  WBC 5.1  NEUTROABS 4.1  HGB 18.0*  HCT 50.2  MCV 86.9  PLT 163   Basic Metabolic Panel: Recent Labs  Lab 06/17/20 1627  NA 129*  K 3.7  CL 94*  CO2 22  GLUCOSE 105*  BUN 15  CREATININE 0.90  CALCIUM 8.5*    Studies: DG Chest Portable 1 View  Result Date: 06/17/2020 CLINICAL DATA:  Patient states SOB, fever of 102.5, rapid pulse, and cough starting today. Patient tested positive for COVID 06/09/2020. No hx of pneumothorax conditions or surgery. Non smoker. EXAM: PORTABLE CHEST 1 VIEW COMPARISON:  Chest radiograph 06/09/2020 FINDINGS: Stable cardiomediastinal contours. There are diffuse bilateral infiltrates. No pneumothorax or large pleural effusion. No acute finding in the visualized skeleton. IMPRESSION: Diffuse bilateral infiltrates most likely representing multifocal pneumonia. Electronically Signed   By: Emmaline Kluver M.D.   On: 06/17/2020 17:09    Scheduled Meds:  enoxaparin (LOVENOX) injection  40 mg Subcutaneous Q24H   methylPREDNISolone (SOLU-MEDROL) injection  60 mg Intravenous BID   Continuous Infusions:  remdesivir 100 mg in NS 100 mL Stopped (06/18/20 0958)   PRN Meds: acetaminophen  Time spent: 35 minutes  Author: Lynden Oxford, MD [facility] 06/18/2020 4:12 PM  To reach On-call, see care teams to locate the attending and reach out via [email or web address]. Between 7PM-7AM, please contact night-coverage If you still have difficulty reaching the attending provider, please page the [facility] (Director on Call) for [facility] on amion for assistance.

## 2020-06-18 NOTE — ED Notes (Signed)
Pt provided lunch tray, sitting up on stretcher. Able to feed self. Denies other needs.

## 2020-06-19 LAB — PROCALCITONIN: Procalcitonin: 0.1 ng/mL

## 2020-06-19 LAB — FIBRIN DERIVATIVES D-DIMER (ARMC ONLY): Fibrin derivatives D-dimer (ARMC): 729.22 ng/mL (FEU) — ABNORMAL HIGH (ref 0.00–499.00)

## 2020-06-19 LAB — C-REACTIVE PROTEIN: CRP: 0.7 mg/dL (ref <1.0)

## 2020-06-19 MED ORDER — GUAIFENESIN-DM 100-10 MG/5ML PO SYRP: 5.0000 mL | ORAL_SOLUTION | ORAL | Status: DC | PRN

## 2020-06-19 MED ORDER — DM-GUAIFENESIN ER 30-600 MG PO TB12
1.0000 | ORAL_TABLET | Freq: Two times a day (BID) | ORAL | Status: DC
  Administered 2020-06-19 – 2020-06-21 (×4): 1 via ORAL
  Filled 2020-06-19 (×4): qty 1

## 2020-06-19 MED ORDER — ONDANSETRON HCL 4 MG/2ML IJ SOLN: 4.0000 mg | Freq: Four times a day (QID) | INTRAMUSCULAR | Status: DC | PRN

## 2020-06-19 MED ORDER — ZOLPIDEM TARTRATE 5 MG PO TABS
5.0000 mg | ORAL_TABLET | Freq: Every evening | ORAL | Status: DC | PRN
  Administered 2020-06-20: 01:00:00 5 mg via ORAL
  Filled 2020-06-19: qty 1

## 2020-06-19 MED ORDER — BENZONATATE 100 MG PO CAPS: 100.0000 mg | ORAL_CAPSULE | Freq: Three times a day (TID) | ORAL | Status: DC | PRN

## 2020-06-19 NOTE — Progress Notes (Signed)
[facility] Progress Note  Patient: Alex Cruz    ZRA:[id number]  DOA: 06/17/2020     Date of Service: the patient was seen and examined on 06/19/2020  Brief hospital course: No past medical history presents with complaints of cough and shortness of breath.  Tested positive for COVID-19 on 06/09/2020.  Unvaccinated. Currently plan is continue current care.  Assessment and Plan: 1.  Acute hypoxic respiratory failure POA Acute COVID-19 Viral Pneumonia CXR: hazy bilateral peripheral opacities Tmax last 24 hours:  Temp (24hrs), Avg:98 F (36.7 C), Min:97.7 F (36.5 C), Max:98.3 F (36.8 C)  Oxygen requirements: On 4 LPM CRP: negative Remdesivir: Started on 06/17/2020. Steroids: Started on 06/17/2020.  Changing Decadron to Solu-Medrol. Baricitinib/Actemra(off-label use): Discussed with the patient.  Currently agreeable to use this medicine if indicated. Patient does not have any past medical history of intestinal rupture or perforation or infection, currently not on any chemotherapy or immunosuppressive therapy. The investigational nature of this medication was discussed with the patient/HCPOA and they choose to proceed as the potential benefits are felt to outweigh risks at this time.  Antibiotics:none Vitamin C and Zinc: continue  DVT Prophylaxis: enoxaparin (LOVENOX) injection 40 mg Start: 06/17/20 2300  Prone positioning: Patient encouraged to stay in prone position as much as possible.  The treatment plan and use of medications and known side effects were discussed with patient/family. It was clearly explained that Complete risks and long-term side effects are unknown, however in the best clinical judgment they seem to be of some clinical benefit rather than medical risks. Patient/family agree with the treatment plan and want to receive these treatments as indicated.  2.  Hyponatremia from hypovolemia Due to poor p.o. intake. Continue monitoring.  3.  Diarrhea. Secondary  to COVID-19. Present before admission. Currently no BM. Monitor.  4.  Anosmia. Secondary to Covid.  Monitor.  Responsible for patient's poor p.o. intake.  5.  Insomnia. Ambien.  Diet: Regular diet DVT Prophylaxis:   enoxaparin (LOVENOX) injection 40 mg Start: 06/17/20 2300   Advance goals of care discussion: Full code  Family Communication: no family was present at bedside, at the time of interview.   Disposition:  Status is: Inpatient  Remains inpatient appropriate because:IV treatments appropriate due to intensity of illness or inability to take PO  Dispo: The patient is from: Home              Anticipated d/c is to: Home              Anticipated d/c date is: > 3 days              Patient currently is not medically stable to d/c.  Subjective: Continues to have shortness of breath.  No nausea or vomiting.  No fever no chills.  Physical Exam:  General: Appear in mild distress, no Rash; Oral Mucosa Clear, moist. no Abnormal Neck Mass Or lumps, Conjunctiva normal  Cardiovascular: S1 and S2 Present, no Murmur, Respiratory: good respiratory effort, Bilateral Air entry present and bilateral  Crackles, Occasional  wheezes Abdomen: Bowel Sound present, Soft and no tenderness Extremities: no Pedal edema, no calf tenderness Neurology: alert and oriented to time, place, and person affect appropriate. no new focal deficit Gait not checked due to patient safety concerns  Vitals:   06/18/20 2007 06/19/20 1203 06/19/20 1551 06/19/20 1942  BP: 125/73 124/83 119/74 121/80  Pulse: 97 80 79 87  Resp: 18 17 (!) 21 17  Temp: 98.3 F (36.8 C) 97.7  F (36.5 C) 98 F (36.7 C) 97.8 F (36.6 C)  TempSrc: Oral Oral Oral Oral  SpO2: 93% 97% 95% 97%  Weight:      Height:        Intake/Output Summary (Last 24 hours) at 06/19/2020 2000 Last data filed at 06/19/2020 0259 Gross per 24 hour  Intake --  Output 850 ml  Net -850 ml   Filed Weights   06/17/20 1621  Weight: 81.6 kg     Data Reviewed: I have personally reviewed and interpreted daily labs, tele strips, imagings as discussed above. I reviewed all nursing notes, pharmacy notes, vitals, pertinent old records I have discussed plan of care as described above with RN and patient/family.  CBC: Recent Labs  Lab 06/17/20 1627  WBC 5.1  NEUTROABS 4.1  HGB 18.0*  HCT 50.2  MCV 86.9  PLT 163   Basic Metabolic Panel: Recent Labs  Lab 06/17/20 1627  NA 129*  K 3.7  CL 94*  CO2 22  GLUCOSE 105*  BUN 15  CREATININE 0.90  CALCIUM 8.5*    Studies: No results found.  Scheduled Meds: . dextromethorphan-guaiFENesin  1 tablet Oral BID  . enoxaparin (LOVENOX) injection  40 mg Subcutaneous Q24H  . methylPREDNISolone (SOLU-MEDROL) injection  60 mg Intravenous BID   Continuous Infusions: . remdesivir 100 mg in NS 100 mL Stopped (06/19/20 1130)   PRN Meds: acetaminophen, benzonatate, ondansetron (ZOFRAN) IV, zolpidem  Time spent: 35 minutes  Author: Lynden Oxford, MD [facility] 06/19/2020 8:00 PM  To reach On-call, see care teams to locate the attending and reach out via [email or web address]. Between 7PM-7AM, please contact night-coverage If you still have difficulty reaching the attending provider, please page the [facility] (Director on Call) for [facility] on amion for assistance.

## 2020-06-20 LAB — COMPREHENSIVE METABOLIC PANEL
ALT: 73 U/L — ABNORMAL HIGH (ref 0–44)
AST: 62 U/L — ABNORMAL HIGH (ref 15–41)
Albumin: 3.1 g/dL — ABNORMAL LOW (ref 3.5–5.0)
Alkaline Phosphatase: 50 U/L (ref 38–126)
Anion gap: 8 (ref 5–15)
BUN: 16 mg/dL (ref 6–20)
CO2: 27 mmol/L (ref 22–32)
Calcium: 8.2 mg/dL — ABNORMAL LOW (ref 8.9–10.3)
Chloride: 103 mmol/L (ref 98–111)
Creatinine, Ser: 0.62 mg/dL (ref 0.61–1.24)
GFR calc Af Amer: >60 mL/min (ref >60)
GFR calc non Af Amer: >60 mL/min (ref >60)
Glucose, Bld: 149 mg/dL — ABNORMAL HIGH (ref 70–99)
Potassium: 4.3 mmol/L (ref 3.5–5.1)
Sodium: 138 mmol/L (ref 135–145)
Total Bilirubin: 0.8 mg/dL (ref 0.3–1.2)
Total Protein: 6.5 g/dL (ref 6.5–8.1)

## 2020-06-20 LAB — CBC
HCT: 44.2 % (ref 39.0–52.0)
Hemoglobin: 15.6 g/dL (ref 13.0–17.0)
MCH: 31.7 pg (ref 26.0–34.0)
MCHC: 35.3 g/dL (ref 30.0–36.0)
MCV: 89.8 fL (ref 80.0–100.0)
Platelets: 195 10*3/uL (ref 150–400)
RBC: 4.92 MIL/uL (ref 4.22–5.81)
RDW: 12 % (ref 11.5–15.5)
WBC: 4.6 10*3/uL (ref 4.0–10.5)
nRBC: 0 % (ref 0.0–0.2)

## 2020-06-20 LAB — FIBRIN DERIVATIVES D-DIMER (ARMC ONLY): Fibrin derivatives D-dimer (ARMC): 507.6 ng/mL (FEU) — ABNORMAL HIGH (ref 0.00–499.00)

## 2020-06-20 LAB — C-REACTIVE PROTEIN: CRP: 0.5 mg/dL (ref <1.0)

## 2020-06-20 NOTE — Plan of Care (Signed)
  Problem: Education: Goal: Knowledge of risk factors and measures for prevention of condition will improve Outcome: Progressing   Problem: Coping: Goal: Psychosocial and spiritual needs will be supported Outcome: Progressing   Problem: Respiratory: Goal: Will maintain a patent airway Outcome: Progressing Goal: Complications related to the disease process, condition or treatment will be avoided or minimized Outcome: Progressing   Problem: Respiratory: Goal: Complications related to the disease process, condition or treatment will be avoided or minimized Outcome: Progressing   Problem: Health Behavior/Discharge Planning: Goal: Ability to manage health-related needs will improve Outcome: Progressing

## 2020-06-20 NOTE — Progress Notes (Signed)
[facility] Progress Note  Patient: Alex Cruz    GYI:[id number]  DOA: 06/17/2020     Date of Service: the patient was seen and examined on 06/20/2020  Brief hospital course: No past medical history presents with complaints of cough and shortness of breath.  Tested positive for COVID-19 on 06/09/2020.  Unvaccinated. Currently plan is treat hypoxia.  Assessment and Plan: 1.  Acute hypoxic respiratory failure POA Acute COVID-19 Viral Pneumonia CXR: hazy bilateral peripheral opacities Oxygen requirements: On 4 LPM CRP: negative Remdesivir: Started on 06/17/2020. Steroids: Started on 06/17/2020.  Changing Decadron to Solu-Medrol. Baricitinib/Actemra(off-label use): Discussed with the patient.  Currently agreeable to use this medicine if indicated. Patient does not have any past medical history of intestinal rupture or perforation or infection, currently not on any chemotherapy or immunosuppressive therapy. The investigational nature of this medication was discussed with the patient/HCPOA and they choose to proceed as the potential benefits are felt to outweigh risks at this time.  Antibiotics:none Vitamin C and Zinc: continue  DVT Prophylaxis: enoxaparin (LOVENOX) injection 40 mg Start: 06/17/20 2300  Prone positioning: Patient encouraged to stay in prone position as much as possible.  The treatment plan and use of medications and known side effects were discussed with patient/family. It was clearly explained that Complete risks and long-term side effects are unknown, however in the best clinical judgment they seem to be of some clinical benefit rather than medical risks. Patient/family agree with the treatment plan and want to receive these treatments as indicated.  2.  Hyponatremia from hypovolemia Due to poor p.o. intake. Continue monitoring.  3.  Diarrhea. Secondary to COVID-19. Present before admission. Currently no BM. Monitor.  4.  Anosmia. Secondary to Covid.   Monitor.  Responsible for patient's poor p.o. intake.  5.  Insomnia. Ambien.  Diet: Regular diet DVT Prophylaxis:   enoxaparin (LOVENOX) injection 40 mg Start: 06/17/20 2300    Advance goals of care discussion: Full code  Family Communication: no family was present at bedside, at the time of interview.   Disposition:  Status is: Inpatient  Remains inpatient appropriate because: Persistent hypoxia   Dispo: The patient is from: Home              Anticipated d/c is to: Home              Anticipated d/c date is: 3 days              Patient currently is not medically stable to d/c.   Subjective: No nausea no vomiting.  Continues to have shortness of breath.  Physical Exam:  General: Appear in mild distress, no Rash; Oral Mucosa Clear, moist. no Abnormal Neck Mass Or lumps, Conjunctiva normal  Cardiovascular: S1 and S2 Present, no Murmur, Respiratory: increased respiratory effort, Bilateral Air entry present and bilateral  Crackles, no wheezes Abdomen: Bowel Sound present, Soft and no tenderness Extremities: no Pedal edema Neurology: alert and oriented to time and place affect appropriate. no new focal deficit Gait not checked due to patient safety concerns  Vitals:   06/20/20 0446 06/20/20 0725 06/20/20 1107 06/20/20 1547  BP: 118/78 112/75 116/74 111/79  Pulse: 74 69 77 80  Resp: 16 17 16 17   Temp: 97.9 F (36.6 C)  97.9 F (36.6 C) 97.9 F (36.6 C)  TempSrc: Oral     SpO2: 97% 97% 96% 92%  Weight:      Height:       No intake or output data in  the 24 hours ending 06/20/20 1845 Filed Weights   06/17/20 1621  Weight: 81.6 kg    Data Reviewed: I have personally reviewed and interpreted daily labs, tele strips, imagings as discussed above. I reviewed all nursing notes, pharmacy notes, vitals, pertinent old records I have discussed plan of care as described above with RN and patient/family.  CBC: Recent Labs  Lab 06/17/20 1627 06/20/20 0452  WBC 5.1 4.6    NEUTROABS 4.1  --   HGB 18.0* 15.6  HCT 50.2 44.2  MCV 86.9 89.8  PLT 163 195   Basic Metabolic Panel: Recent Labs  Lab 06/17/20 1627 06/20/20 0452  NA 129* 138  K 3.7 4.3  CL 94* 103  CO2 22 27  GLUCOSE 105* 149*  BUN 15 16  CREATININE 0.90 0.62  CALCIUM 8.5* 8.2*    Studies: No results found.  Scheduled Meds: . dextromethorphan-guaiFENesin  1 tablet Oral BID  . enoxaparin (LOVENOX) injection  40 mg Subcutaneous Q24H  . methylPREDNISolone (SOLU-MEDROL) injection  60 mg Intravenous BID   Continuous Infusions: . remdesivir 100 mg in NS 100 mL 100 mg (06/20/20 1037)   PRN Meds: acetaminophen, benzonatate, ondansetron (ZOFRAN) IV, zolpidem  Time spent: 35 minutes  Author: Lynden Oxford, MD [facility] 06/20/2020 6:45 PM  To reach On-call, see care teams to locate the attending and reach out via [email or web address]. Between 7PM-7AM, please contact night-coverage If you still have difficulty reaching the attending provider, please page the [facility] (Director on Call) for [facility] on amion for assistance.

## 2020-06-21 DIAGNOSIS — E871 Hypo-osmolality and hyponatremia: Secondary | ICD-10-CM

## 2020-06-21 DIAGNOSIS — U071 COVID-19: Principal | ICD-10-CM

## 2020-06-21 DIAGNOSIS — J9600 Acute respiratory failure, unspecified whether with hypoxia or hypercapnia: Secondary | ICD-10-CM

## 2020-06-21 DIAGNOSIS — R7989 Other specified abnormal findings of blood chemistry: Secondary | ICD-10-CM

## 2020-06-21 DIAGNOSIS — R7301 Impaired fasting glucose: Secondary | ICD-10-CM

## 2020-06-21 DIAGNOSIS — J1282 Pneumonia due to coronavirus disease 2019: Secondary | ICD-10-CM

## 2020-06-21 LAB — COMPREHENSIVE METABOLIC PANEL
ALT: 241 U/L — ABNORMAL HIGH (ref 0–44)
AST: 180 U/L — ABNORMAL HIGH (ref 15–41)
Albumin: 3.1 g/dL — ABNORMAL LOW (ref 3.5–5.0)
Alkaline Phosphatase: 46 U/L (ref 38–126)
Anion gap: 5 (ref 5–15)
BUN: 17 mg/dL (ref 6–20)
CO2: 25 mmol/L (ref 22–32)
Calcium: 8.5 mg/dL — ABNORMAL LOW (ref 8.9–10.3)
Chloride: 106 mmol/L (ref 98–111)
Creatinine, Ser: 0.77 mg/dL (ref 0.61–1.24)
GFR calc Af Amer: >60 mL/min (ref >60)
GFR calc non Af Amer: >60 mL/min (ref >60)
Glucose, Bld: 140 mg/dL — ABNORMAL HIGH (ref 70–99)
Potassium: 4.7 mmol/L (ref 3.5–5.1)
Sodium: 136 mmol/L (ref 135–145)
Total Bilirubin: 1 mg/dL (ref 0.3–1.2)
Total Protein: 6.4 g/dL — ABNORMAL LOW (ref 6.5–8.1)

## 2020-06-21 LAB — CBC
HCT: 44.2 % (ref 39.0–52.0)
Hemoglobin: 16.3 g/dL (ref 13.0–17.0)
MCH: 32 pg (ref 26.0–34.0)
MCHC: 36.9 g/dL — ABNORMAL HIGH (ref 30.0–36.0)
MCV: 86.7 fL (ref 80.0–100.0)
Platelets: 253 10*3/uL (ref 150–400)
RBC: 5.1 MIL/uL (ref 4.22–5.81)
RDW: 11.9 % (ref 11.5–15.5)
WBC: 7.6 10*3/uL (ref 4.0–10.5)
nRBC: 0 % (ref 0.0–0.2)

## 2020-06-21 LAB — C-REACTIVE PROTEIN: CRP: <0.5 mg/dL (ref <1.0)

## 2020-06-21 LAB — FIBRIN DERIVATIVES D-DIMER (ARMC ONLY): Fibrin derivatives D-dimer (ARMC): 315.64 ng/mL (FEU) (ref 0.00–499.00)

## 2020-06-21 MED ORDER — ZINC 220 (50 ZN) MG PO CAPS: 220.0000 mg | ORAL_CAPSULE | Freq: Every day | ORAL | 0 refills | Status: DC

## 2020-06-21 MED ORDER — VITAMIN C 250 MG PO TABS: 250.0000 mg | ORAL_TABLET | Freq: Two times a day (BID) | ORAL | 0 refills | Status: DC

## 2020-06-21 MED ORDER — PREDNISONE 10 MG PO TABS: ORAL_TABLET | ORAL | 0 refills | Status: DC

## 2020-06-21 MED ORDER — ALBUTEROL SULFATE HFA 108 (90 BASE) MCG/ACT IN AERS: 2.0000 | INHALATION_SPRAY | Freq: Four times a day (QID) | RESPIRATORY_TRACT | 2 refills | Status: DC | PRN

## 2020-06-21 NOTE — Progress Notes (Signed)
SATURATION QUALIFICATIONS: (This note is used to comply with regulatory documentation for home oxygen)  Patient Saturations on Room Air at Rest = 86%  Patient Saturations on Room Air while Ambulating = 86%  Patient Saturations on 2 Liters of oxygen while Ambulating = 93%  Please briefly explain why patient needs home oxygen:sats drop on room air

## 2020-06-21 NOTE — Discharge Summary (Signed)
[facility] - [place] at [facility]   PATIENT NAME: Alex Cruz    MR#:  [id number]  DATE OF BIRTH:  1986-05-11  DATE OF ADMISSION:  06/17/2020 ADMITTING PHYSICIAN: Anselm Jungling, DO  DATE OF DISCHARGE: 06/21/2020  PRIMARY CARE PHYSICIAN: Patient, No Pcp Per    ADMISSION DIAGNOSIS:  SOB (shortness of breath) [R06.02] Pneumonia due to COVID-19 virus [U07.1, J12.82]  DISCHARGE DIAGNOSIS:  Principal Problem:   Acute respiratory failure due to COVID-19 [facility]) Active Problems:   Pneumonia due to COVID-19 virus   Hyponatremia   SECONDARY DIAGNOSIS:   Past Medical History:  Diagnosis Date  . COVID-19 06/10/2020  . No known health problems     HOSPITAL COURSE:   1.  Acute hypoxic respiratory failure secondary to COVID-19 pneumonia.  We tried to taper the patient off oxygen.  Unfortunately he did desaturate with room air with ambulation down to 86%.  The patient qualifies for home oxygen.  The patient with ambulation with 2 L stayed at 93%.  The patient was set up for home oxygen.  Completed 5 days of remdesivir.  I will give another 5 days of steroid upon discharge home.  I will give vitamin C and zinc and an albuterol inhaler.  Will refer to pulmonary as outpatient will likely need a 6-minute exercise test in order to come off oxygen. 2.  Hyponatremia on presentation now in the normal range. 3.  Impaired fasting glucose secondary to steroids.  This should get better and coming off steroids in 5 days. 4.  Elevated liver function test likely secondary to Covid infection but could also be secondary to remdesivir.  Remdesivir course completed.  Recheck liver function test as outpatient.  DISCHARGE CONDITIONS:   Satisfactory  CONSULTS OBTAINED:  None  DRUG ALLERGIES:  No Known Allergies  DISCHARGE MEDICATIONS:   Allergies as of 06/21/2020   No Known Allergies     Medication List    STOP taking these medications   HYDROcodone-homatropine 5-1.5 MG/5ML  syrup Commonly known as: HYCODAN     TAKE these medications   albuterol 108 (90 Base) MCG/ACT inhaler Commonly known as: VENTOLIN HFA Inhale 2 puffs into the lungs every 6 (six) hours as needed for wheezing or shortness of breath.   predniSONE 10 MG tablet Commonly known as: DELTASONE 5 tabs po for five days Start taking on: June 22, 2020 What changed:   medication strength  additional instructions   vitamin C 250 MG tablet Commonly known as: ASCORBIC ACID Take 1 tablet (250 mg total) by mouth 2 (two) times daily.   Zinc 220 (50 Zn) MG Caps Take 220 mg by mouth daily.            Durable Medical Equipment  (From admission, onward)         Start     Ordered   06/21/20 0950  For home use only DME oxygen  Once       Question Answer Comment  Length of Need 6 Months   Mode or (Route) Nasal cannula   Liters per Minute 2   Frequency Continuous (stationary and portable oxygen unit needed)   Oxygen conserving device Yes   Oxygen delivery system Gas      06/21/20 0949           DISCHARGE INSTRUCTIONS:   Follow-up your medical doctor 2 weeks Follow-up pulmonary 1 month  If you experience worsening of your admission symptoms, develop shortness of breath, life threatening emergency, suicidal  or homicidal thoughts you must seek medical attention immediately by calling 911 or calling your MD immediately  if symptoms less severe.  You Must read complete instructions/literature along with all the possible adverse reactions/side effects for all the Medicines you take and that have been prescribed to you. Take any new Medicines after you have completely understood and accept all the possible adverse reactions/side effects.   Please note  You were cared for by a hospitalist during your hospital stay. If you have any questions about your discharge medications or the care you received while you were in the hospital after you are discharged, you can call the unit and asked  to speak with the hospitalist on call if the hospitalist that took care of you is not available. Once you are discharged, your primary care physician will handle any further medical issues. Please note that NO REFILLS for any discharge medications will be authorized once you are discharged, as it is imperative that you return to your primary care physician (or establish a relationship with a primary care physician if you do not have one) for your aftercare needs so that they can reassess your need for medications and monitor your lab values.    Today   CHIEF COMPLAINT:   Chief Complaint  Patient presents with  . Shortness of Breath    COVID Positive    HISTORY OF PRESENT ILLNESS:  Alex Cruz  is a 34 y.o. male came in with shortness of breath and found to have COVID-19 pneumonia and acute hypoxic respiratory failure   VITAL SIGNS:  Blood pressure 111/79, pulse 68, temperature 98.5 F (36.9 C), temperature source Oral, resp. rate 20, height 5\' 7"  (1.702 m), weight 81.6 kg, SpO2 95 %.  PHYSICAL EXAMINATION:  GENERAL:  34 y.o.-year-old patient lying in the bed with no acute distress.  EYES: Pupils equal, round, reactive to light and accommodation. No scleral icterus. Extraocular muscles intact.  HEENT: Head atraumatic, normocephalic. Oropharynx and nasopharynx clear.  LUNGS: Decreased breath sounds bilateral bases, no wheezing, rales,rhonchi or crepitation. No use of accessory muscles of respiration.  CARDIOVASCULAR: S1, S2 normal. No murmurs, rubs, or gallops.  ABDOMEN: Soft, non-tender, non-distended. Bowel sounds present. No organomegaly or mass.  EXTREMITIES: No pedal edema.  NEUROLOGIC: Cranial nerves II through XII are intact. Muscle strength 5/5 in all extremities. Sensation intact. Gait not checked.  PSYCHIATRIC: The patient is alert and oriented x 3.  SKIN: No obvious rash, lesion, or ulcer.   DATA REVIEW:   CBC Recent Labs  Lab 06/21/20 0626  WBC 7.6  HGB 16.3  HCT  44.2  PLT 253    Chemistries  Recent Labs  Lab 06/21/20 0626  NA 136  K 4.7  CL 106  CO2 25  GLUCOSE 140*  BUN 17  CREATININE 0.77  CALCIUM 8.5*  AST 180*  ALT 241*  ALKPHOS 46  BILITOT 1.0    Microbiology Results  Results for orders placed or performed during the hospital encounter of 06/17/20  Culture, blood (Routine x 2)     Status: None (Preliminary result)   Collection Time: 06/17/20  5:33 PM   Specimen: BLOOD  Result Value Ref Range Status   Specimen Description BLOOD LEFT FOREARM  Final   Special Requests   Final    BOTTLES DRAWN AEROBIC AND ANAEROBIC Blood Culture results may not be optimal due to an excessive volume of blood received in culture bottles   Culture   Final    NO GROWTH  4 DAYS Performed at [facility], [address] [place], [place] [address]    Report Status PENDING  Incomplete  Culture, blood (Routine x 2)     Status: None (Preliminary result)   Collection Time: 06/17/20  5:33 PM   Specimen: BLOOD  Result Value Ref Range Status   Specimen Description BLOOD LEFT FOREARM  Final   Special Requests   Final    BOTTLES DRAWN AEROBIC AND ANAEROBIC Blood Culture results may not be optimal due to an excessive volume of blood received in culture bottles   Culture   Final    NO GROWTH 4 DAYS Performed at [facility], [address], [place], [place] [address]    Report Status PENDING  Incomplete     Management plans discussed with the patient, family and they are in agreement.  CODE STATUS:     Code Status Orders  (From admission, onward)         Start     Ordered   06/17/20 1849  Full code  Continuous        06/17/20 1849        Code Status History    This patient has a current code status but no historical code status.   Advance Care Planning Activity      TOTAL TIME TAKING CARE OF THIS PATIENT: 34 minutes.    Alford Highland M.D on 06/21/2020 at 4:30 PM  Between 7am to 6pm - Pager -  [phone]  After 6pm go to [email or web address] - password [email or web address]  [facility]  CC: Primary care physician; Patient, No Pcp Per

## 2020-06-21 NOTE — Discharge Instructions (Signed)
COVID-19: How to Protect Yourself and Others Know how it spreads  There is currently no vaccine to prevent coronavirus disease 2019 (COVID-19).  The best way to prevent illness is to avoid being exposed to this virus.  The virus is thought to spread mainly from person-to-person. ? Between people who are in close contact with one another (within about 6 feet). ? Through respiratory droplets produced when an infected person coughs, sneezes or talks. ? These droplets can land in the mouths or noses of people who are nearby or possibly be inhaled into the lungs. ? COVID-19 may be spread by people who are not showing symptoms. Everyone should Clean your hands often  Wash your hands often with soap and water for at least 20 seconds especially after you have been in a public place, or after blowing your nose, coughing, or sneezing.  If soap and water are not readily available, use a hand sanitizer that contains at least 60% alcohol. Cover all surfaces of your hands and rub them together until they feel dry.  Avoid touching your eyes, nose, and mouth with unwashed hands. Avoid close contact  Limit contact with others as much as possible.  Avoid close contact with people who are sick.  Put distance between yourself and other people. ? Remember that some people without symptoms may be able to spread virus. ? This is especially important for people who are at higher risk of getting very GainPain.com.cy Cover your mouth and nose with a mask when around others  You could spread COVID-19 to others even if you do not feel sick.  Everyone should wear a mask in public settings and when around people not living in their household, especially when social distancing is difficult to maintain. ? Masks should not be placed on young children under age 37, anyone who has trouble breathing, or is unconscious, incapacitated or otherwise  unable to remove the mask without assistance.  The mask is meant to protect other people in case you are infected.  Do NOT use a facemask meant for a Dietitian.  Continue to keep about 6 feet between yourself and others. The mask is not a substitute for social distancing. Cover coughs and sneezes  Always cover your mouth and nose with a tissue when you cough or sneeze or use the inside of your elbow.  Throw used tissues in the trash.  Immediately wash your hands with soap and water for at least 20 seconds. If soap and water are not readily available, clean your hands with a hand sanitizer that contains at least 60% alcohol. Clean and disinfect  Clean AND disinfect frequently touched surfaces daily. This includes tables, doorknobs, light switches, countertops, handles, desks, phones, keyboards, toilets, faucets, and sinks. RackRewards.fr  If surfaces are dirty, clean them: Use detergent or soap and water prior to disinfection.  Then, use a household disinfectant. You can see a list of EPA-registered household disinfectants here. michellinders.com 06/23/2019 This information is not intended to replace advice given to you by your health care provider. Make sure you discuss any questions you have with your health care provider. Document Revised: 07/01/2019 Document Reviewed: 04/29/2019 Elsevier Patient Education  [place] Can Do to Manage Your COVID-19 Symptoms at Home If you have possible or confirmed COVID-19: 1. Stay home from work and school. And stay away from other public places. If you must go out, avoid using any kind of public transportation, ridesharing, or taxis. 2. Monitor your symptoms  carefully. If your symptoms get worse, call your healthcare provider immediately. 3. Get rest and stay hydrated. 4. If you have a medical appointment, call the healthcare provider ahead of  time and tell them that you have or may have COVID-19. 5. For medical emergencies, call 911 and notify the dispatch personnel that you have or may have COVID-19. 6. Cover your cough and sneezes with a tissue or use the inside of your elbow. 7. Wash your hands often with soap and water for at least 20 seconds or clean your hands with an alcohol-based hand sanitizer that contains at least 60% alcohol. 8. As much as possible, stay in a specific room and away from other people in your home. Also, you should use a separate bathroom, if available. If you need to be around other people in or outside of the home, wear a mask. 9. Avoid sharing personal items with other people in your household, like dishes, towels, and bedding. 10. Clean all surfaces that are touched often, like counters, tabletops, and doorknobs. Use household cleaning sprays or wipes according to the label instructions. SouthAmericaFlowers.co.uk 04/21/2019 This information is not intended to replace advice given to you by your health care provider. Make sure you discuss any questions you have with your health care provider. Document Revised: 09/23/2019 Document Reviewed: 09/23/2019 Elsevier Patient Education  2020 [employer].   COVID-19 Frequently Asked Questions COVID-19 (coronavirus disease) is an infection that is caused by a large family of viruses. Some viruses cause illness in people and others cause illness in animals like camels, cats, and bats. In some cases, the viruses that cause illness in animals can spread to humans. Where did the coronavirus come from? In December 2019, [place] told the [facility]) of several cases of lung disease (human respiratory illness). These cases were linked to an open seafood and livestock market in the city of [place]. The link to the seafood and livestock market suggests that the virus may have spread from animals to humans. However, since that first outbreak in December, the virus  has also been shown to spread from person to person. What is the name of the disease and the virus? Disease name Early on, this disease was called novel coronavirus. This is because scientists determined that the disease was caused by a new (novel) respiratory virus. The World Health Organization [facility]) has now named the disease COVID-19, or coronavirus disease. Virus name The virus that causes the disease is called severe acute respiratory syndrome coronavirus 2 (SARS-CoV-2). More information on disease and virus naming World Health Organization [facility]): www.who.int/emergencies/diseases/novel-coronavirus-2019/technical-guidance/naming-the-coronavirus-disease-(covid-2019)-and-the-virus-that-causes-it Who is at risk for complications from coronavirus disease? Some people may be at higher risk for complications from coronavirus disease. This includes older adults and people who have chronic diseases, such as heart disease, diabetes, and lung disease. If you are at higher risk for complications, take these extra precautions:  Stay home as much as possible.  Avoid social gatherings and travel.  Avoid close contact with others. Stay at least 6 ft (2 m) away from others, if possible.  Wash your hands often with soap and water for at least 20 seconds.  Avoid touching your face, mouth, nose, or eyes.  Keep supplies on hand at home, such as food, medicine, and cleaning supplies.  If you must go out in public, wear a cloth face covering or face mask. Make sure your mask covers your nose and mouth. How does coronavirus disease spread? The virus that causes coronavirus disease spreads  easily from person to person (is contagious). You may catch the virus by:  Breathing in droplets from an infected person. Droplets can be spread by a person breathing, speaking, singing, coughing, or sneezing.  Touching something, like a table or a doorknob, that was exposed to the virus (contaminated) and then touching  your mouth, nose, or eyes. Can I get the virus from touching surfaces or objects? There is still a lot that we do not know about the virus that causes coronavirus disease. Scientists are basing a lot of information on what they know about similar viruses, such as:  Viruses cannot generally survive on surfaces for long. They need a human body (host) to survive.  It is more likely that the virus is spread by close contact with people who are sick (direct contact), such as through: ? Shaking hands or hugging. ? Breathing in respiratory droplets that travel through the air. Droplets can be spread by a person breathing, speaking, singing, coughing, or sneezing.  It is less likely that the virus is spread when a person touches a surface or object that has the virus on it (indirect contact). The virus may be able to enter the body if the person touches a surface or object and then touches his or her face, eyes, nose, or mouth. Can a person spread the virus without having symptoms of the disease? It may be possible for the virus to spread before a person has symptoms of the disease, but this is most likely not the main way the virus is spreading. It is more likely for the virus to spread by being in close contact with people who are sick and breathing in the respiratory droplets spread by a person breathing, speaking, singing, coughing, or sneezing. What are the symptoms of coronavirus disease? Symptoms vary from person to person and can range from mild to severe. Symptoms may include:  Fever or chills.  Cough.  Difficulty breathing or feeling short of breath.  Headaches, body aches, or muscle aches.  Runny or stuffy (congested) nose.  Sore throat.  New loss of taste or smell.  Nausea, vomiting, or diarrhea. These symptoms can appear anywhere from 2 to 14 days after you have been exposed to the virus. Some people may not have any symptoms. If you develop symptoms, call your health care  provider. People with severe symptoms may need hospital care. Should I be tested for this virus? Your health care provider will decide whether to test you based on your symptoms, history of exposure, and your risk factors. How does a health care provider test for this virus? Health care providers will collect samples to send for testing. Samples may include:  Taking a swab of fluid from the back of your nose and throat, your nose, or your throat.  Taking fluid from the lungs by having you cough up mucus (sputum) into a sterile cup.  Taking a blood sample. Is there a treatment or vaccine for this virus? Currently, there is no vaccine to prevent coronavirus disease. Also, there are no medicines like antibiotics or antivirals to treat the virus. A person who becomes sick is given supportive care, which means rest and fluids. A person may also relieve his or her symptoms by using over-the-counter medicines that treat sneezing, coughing, and runny nose. These are the same medicines that a person takes for the common cold. If you develop symptoms, call your health care provider. People with severe symptoms may need hospital care. What can I do  to protect myself and my family from this virus?     You can protect yourself and your family by taking the same actions that you would take to prevent the spread of other viruses. Take the following actions:  Wash your hands often with soap and water for at least 20 seconds. If soap and water are not available, use alcohol-based hand sanitizer.  Avoid touching your face, mouth, nose, or eyes.  Cough or sneeze into a tissue, sleeve, or elbow. Do not cough or sneeze into your hand or the air. ? If you cough or sneeze into a tissue, throw it away immediately and wash your hands.  Disinfect objects and surfaces that you frequently touch every day.  Stay away from people who are sick.  Avoid going out in public, follow guidance from your state and local  health authorities.  Avoid crowded indoor spaces. Stay at least 6 ft (2 m) away from others.  If you must go out in public, wear a cloth face covering or face mask. Make sure your mask covers your nose and mouth.  Stay home if you are sick, except to get medical care. Call your health care provider before you get medical care. Your health care provider will tell you how long to stay home.  Make sure your vaccines are up to date. Ask your health care provider what vaccines you need. What should I do if I need to travel? Follow travel recommendations from your local health authority, the CDC, and WHO. Travel information and advice  Centers for Disease Control and Prevention (CDC): BodyEditor.hu  World Health Organization [facility]): ThirdIncome.ca Know the risks and take action to protect your health  You are at higher risk of getting coronavirus disease if you are traveling to areas with an outbreak or if you are exposed to travelers from areas with an outbreak.  Wash your hands often and practice good hygiene to lower the risk of catching or spreading the virus. What should I do if I am sick? General instructions to stop the spread of infection  Wash your hands often with soap and water for at least 20 seconds. If soap and water are not available, use alcohol-based hand sanitizer.  Cough or sneeze into a tissue, sleeve, or elbow. Do not cough or sneeze into your hand or the air.  If you cough or sneeze into a tissue, throw it away immediately and wash your hands.  Stay home unless you must get medical care. Call your health care provider or local health authority before you get medical care.  Avoid public areas. Do not take public transportation, if possible.  If you can, wear a mask if you must go out of the house or if you are in close contact with someone who is not sick. Make sure your  mask covers your nose and mouth. Keep your home clean  Disinfect objects and surfaces that are frequently touched every day. This may include: ? Counters and tables. ? Doorknobs and light switches. ? Sinks and faucets. ? Electronics such as phones, remote controls, keyboards, computers, and tablets.  Wash dishes in hot, soapy water or use a dishwasher. Air-dry your dishes.  Wash laundry in hot water. Prevent infecting other household members  Let healthy household members care for children and pets, if possible. If you have to care for children or pets, wash your hands often and wear a mask.  Sleep in a different bedroom or bed, if possible.  Do not share  personal items, such as razors, toothbrushes, deodorant, combs, brushes, towels, and washcloths. Where to find more information Centers for Disease Control and Prevention (CDC)  Information and news updates: CardRetirement.cz World Health Organization [facility])  Information and news updates: AffordableSalon.es  Coronavirus health topic: https://thompson-craig.com/  Questions and answers on COVID-19: kruiseway.com  Global tracker: who.sprinklr.com American Academy of Pediatrics (AAP)  Information for families: www.healthychildren.org/English/health-issues/conditions/chest-lungs/Pages/2019-Novel-Coronavirus.aspx The coronavirus situation is changing rapidly. Check your local health authority website or the CDC and [facility] websites for updates and news. When should I contact a health care provider?  Contact your health care provider if you have symptoms of an infection, such as fever or cough, and you: ? Have been near anyone who is known to have coronavirus disease. ? Have come into contact with a person who is suspected to have coronavirus disease. ? Have traveled to an area where there is an outbreak of COVID-19. When should I get  emergency medical care?  Get help right away by calling your local emergency services (911 in the [place] if you have: ? Trouble breathing. ? Pain or pressure in your chest. ? Confusion. ? Blue-tinged lips and fingernails. ? Difficulty waking from sleep. ? Symptoms that get worse. Let the emergency medical personnel know if you think you have coronavirus disease. Summary  A new respiratory virus is spreading from person to person and causing COVID-19 (coronavirus disease).  The virus that causes COVID-19 appears to spread easily. It spreads from one person to another through droplets from breathing, speaking, singing, coughing, or sneezing.  Older adults and those with chronic diseases are at higher risk of disease. If you are at higher risk for complications, take extra precautions.  There is currently no vaccine to prevent coronavirus disease. There are no medicines, such as antibiotics or antivirals, to treat the virus.  You can protect yourself and your family by washing your hands often, avoiding touching your face, and covering your coughs and sneezes. This information is not intended to replace advice given to you by your health care provider. Make sure you discuss any questions you have with your health care provider. Document Revised: 08/06/2019 Document Reviewed: 02/02/2019 Elsevier Patient Education  2020 [employer].

## 2020-06-21 NOTE — Progress Notes (Signed)
o2 tank given to pt, explained use, pt states and demonstrates understanding, pt with no complaints, transport taking pt to car

## 2020-06-21 NOTE — Progress Notes (Signed)
Pt for discharge home. A/o no resp distress. 02 2 l [place] to go home with pt.instructions discussed with pt. Diet meds activity and f/u.covid info provided for  Pt and  Instructed incont  Precautions at home.pulse ox and thermoniter provided. Pt verbalizes understanding of  Discharge plans

## 2020-06-21 NOTE — TOC Initial Note (Signed)
Transition of Care [facility]) - Initial/Assessment Note    Patient Details  Name: Alex Cruz MRN: [id number] Date of Birth: [birth year]  [facility]) CM/SW Contact:    Allayne Butcher, RN Phone Number: 06/21/2020, 10:33 AM  Clinical Narrative:                 Patient is medically cleared for discharge home.  Patient will need home oxygen.  Jenness Corner with [employer] given referral for oxygen.  Oxygen will be delivered to the room before patient is discharged home.   Expected Discharge Plan: Home/Self Care Barriers to Discharge: No Barriers Identified   Patient Goals and CMS Choice        Expected Discharge Plan and Services Expected Discharge Plan: Home/Self Care   Discharge Planning Services: CM Consult     Expected Discharge Date: 06/21/20               DME Arranged: Oxygen, Pulse oximeter DME Agency: [employer] Date DME Agency Contacted: 06/21/20 Time DME Agency Contacted: [id number] Representative spoke with at DME Agency: Oletha Cruel            Prior Living Arrangements/Services     Patient language and need for interpreter reviewed:: Yes              Criminal Activity/Legal Involvement Pertinent to Current Situation/Hospitalization: No - Comment as needed  Activities of Daily Living Home Assistive Devices/Equipment: None ADL Screening (condition at time of admission) Patient's cognitive ability adequate to safely complete daily activities?: Yes Is the patient deaf or have difficulty hearing?: No Does the patient have difficulty seeing, even when wearing glasses/contacts?: No Does the patient have difficulty concentrating, remembering, or making decisions?: No Patient able to express need for assistance with ADLs?: No Does the patient have difficulty dressing or bathing?: No Independently performs ADLs?: Yes (appropriate for developmental age) Does the patient have difficulty walking or climbing stairs?: No Weakness of Legs: None Weakness of  Arms/Hands: None  Permission Sought/Granted                  Emotional Assessment              Admission diagnosis:  SOB (shortness of breath) [R06.02] Pneumonia due to COVID-19 virus [U07.1, J12.82] Patient Active Problem List   Diagnosis Date Noted  . Pneumonia due to COVID-19 virus 06/17/2020  . Acute respiratory failure due to COVID-19 (HCC) 06/17/2020  . Hyponatremia 06/17/2020   PCP:  Patient, No Pcp Per Pharmacy:   [facility] [address] - [place], [place] - [address] [place] [address] Phone: [phone] Fax: [phone]  [id number] [place], [place] - [address] [place] [address] Phone: [phone] Fax: [phone]  [id number] [place],  - [address] AT [facility] OF [address] [facility] [address] [place] [address] Phone: [phone] Fax: [phone]     Social Determinants of Health (SDOH) Interventions    Readmission Risk Interventions No flowsheet data found.

## 2020-06-21 NOTE — Progress Notes (Signed)
Patient ID: [name] [facility] [place] [facility]        Alex Cruz was admitted to the Hospital on 06/17/2020 and Discharged  06/21/2020 and should be excused from work/school   for 14 days starting 06/17/2020 , may return to work/school without any restrictions.  Alford Highland M.D on 06/21/2020,at 9:54 AM  [facility] [place] [facility]

## 2020-06-21 NOTE — Progress Notes (Signed)
Out via w/c to home no c/o 02 tank came

## 2020-06-22 LAB — CULTURE, BLOOD (ROUTINE X 2)
Culture: NO GROWTH
Culture: NO GROWTH

## 2020-07-04 ENCOUNTER — Other Ambulatory Visit: Payer: Self-pay

## 2020-07-04 ENCOUNTER — Encounter: Payer: Self-pay | Admitting: Family Medicine

## 2020-07-04 ENCOUNTER — Ambulatory Visit: Payer: BC Managed Care – PPO | Admitting: Family Medicine

## 2020-07-04 VITALS — BP 108/60 | HR 83 | Temp 98.3°F | Resp 18 | Ht 67.0 in | Wt 180.8 lb

## 2020-07-04 DIAGNOSIS — U071 COVID-19: Secondary | ICD-10-CM | POA: Diagnosis not present

## 2020-07-04 DIAGNOSIS — R058 Other specified cough: Secondary | ICD-10-CM | POA: Insufficient documentation

## 2020-07-04 DIAGNOSIS — R05 Cough: Secondary | ICD-10-CM

## 2020-07-04 NOTE — Assessment & Plan Note (Signed)
Sx started 06/07/2020, dx 06/09/2020, hospitalized 06/17/20-06/21/2020  FMLA paperwork completed 07/04/2020, to [facility] in 2 weeks for re-evalation

## 2020-07-04 NOTE — Progress Notes (Signed)
Subjective:    Patient ID: Alex Cruz, male    DOB: [birth year], 34 y.o.   MRN: [id number]  Alex Cruz is a 34 y.o. male presenting on 07/04/2020 for Establish Care (pt requesting paperwork to be fill out to keep him out of work due to his prolong respiratory issue post COVID -19 & Pneumonia.He complians of  SOB w/ exertion, cough, fatigue and chest discomfort. )   HPI  Previous PCP was at > 10 years ago.  Records will not be requested.  Past medical, family, and surgical history reviewed w/ pt.  Has acute concerns today for FMLA paperwork to be completed due to his diagnosis of COVID on 06/09/2020.  Has been having difficulty with persistent dry cough, shortness of breath with walking and any kind of exertion, and fatigue.  Denies any new fevers, lymphadenopathy, sore throat, change in taste/smell, CP, abdominal pain, n/v/d.  Depression screen PHQ 2/9 07/04/2020  Decreased Interest 0  Down, Depressed, Hopeless 0  PHQ - 2 Score 0    Social History   Tobacco Use  . Smoking status: Never Smoker  . Smokeless tobacco: Never Used  Vaping Use  . Vaping Use: Never used  Substance Use Topics  . Alcohol use: Not Currently    Comment: social  . Drug use: No    Review of Systems  Constitutional: Positive for fatigue. Negative for activity change, appetite change, chills, diaphoresis, fever and unexpected weight change.  HENT: Negative.   Eyes: Negative.   Respiratory: Positive for cough and shortness of breath. Negative for apnea, choking, chest tightness, wheezing and stridor.   Cardiovascular: Negative.   Gastrointestinal: Negative.   Endocrine: Negative.   Genitourinary: Negative.   Musculoskeletal: Negative.   Skin: Negative.   Allergic/Immunologic: Negative.   Neurological: Negative.   Hematological: Negative.   Psychiatric/Behavioral: Negative.    Per HPI unless specifically indicated above     Objective:    BP 108/60 (BP Location: Left Arm, Patient Position:  Sitting, Cuff Size: Normal)   Pulse 83   Temp 98.3 F (36.8 C) (Oral)   Resp 18   Ht 5\' 7"  (1.702 m)   Wt 180 lb 12.8 oz (82 kg)   SpO2 98%   BMI 28.32 kg/m   Wt Readings from Last 3 Encounters:  07/04/20 180 lb 12.8 oz (82 kg)  06/17/20 180 lb (81.6 kg)  06/17/20 180 lb (81.6 kg)    Physical Exam Vitals reviewed.  Constitutional:      General: He is not in acute distress.    Appearance: Normal appearance. He is well-developed, well-groomed and overweight. He is not ill-appearing or toxic-appearing.  HENT:     Head: Normocephalic and atraumatic.     Nose:     Comments: 06/19/20 is in place, covering mouth and nose. Eyes:     General:        Right eye: No discharge.        Left eye: No discharge.     Extraocular Movements: Extraocular movements intact.     Conjunctiva/sclera: Conjunctivae normal.     Pupils: Pupils are equal, round, and reactive to light.  Cardiovascular:     Rate and Rhythm: Normal rate and regular rhythm.     Pulses: Normal pulses.     Heart sounds: Normal heart sounds. No murmur heard.  No friction rub. No gallop.   Pulmonary:     Effort: Pulmonary effort is normal. No respiratory distress.     Breath sounds:  Normal breath sounds.  Musculoskeletal:     Right lower leg: No edema.     Left lower leg: No edema.  Skin:    General: Skin is warm and dry.     Capillary Refill: Capillary refill takes less than 2 seconds.  Neurological:     General: No focal deficit present.     Mental Status: He is alert and oriented to person, place, and time.  Psychiatric:        Attention and Perception: Attention and perception normal.        Mood and Affect: Mood and affect normal.        Speech: Speech normal.        Behavior: Behavior normal. Behavior is cooperative.        Thought Content: Thought content normal.        Cognition and Memory: Cognition and memory normal.    Results for orders placed or performed during the hospital encounter of 06/17/20   Culture, blood (Routine x 2)   Specimen: BLOOD  Result Value Ref Range   Specimen Description BLOOD LEFT FOREARM    Special Requests      BOTTLES DRAWN AEROBIC AND ANAEROBIC Blood Culture results may not be optimal due to an excessive volume of blood received in culture bottles   Culture      NO GROWTH 5 DAYS Performed at [facility], [address] [place], [place] [address]    Report Status 06/22/2020 FINAL   Culture, blood (Routine x 2)   Specimen: BLOOD  Result Value Ref Range   Specimen Description BLOOD LEFT FOREARM    Special Requests      BOTTLES DRAWN AEROBIC AND ANAEROBIC Blood Culture results may not be optimal due to an excessive volume of blood received in culture bottles   Culture      NO GROWTH 5 DAYS Performed at [facility], [address] [place], [place] [address]    Report Status 06/22/2020 FINAL   Comprehensive metabolic panel  Result Value Ref Range   Sodium 129 (L) 135 - 145 mmol/L   Potassium 3.7 3.5 - 5.1 mmol/L   Chloride 94 (L) 98 - 111 mmol/L   CO2 22 22 - 32 mmol/L   Glucose, Bld 105 (H) 70 - 99 mg/dL   BUN 15 6 - 20 mg/dL   Creatinine, Ser 5.45 0.61 - 1.24 mg/dL   Calcium 8.5 (L) 8.9 - 10.3 mg/dL   Total Protein 7.7 6.5 - 8.1 g/dL   Albumin 3.8 3.5 - 5.0 g/dL   AST 58 (H) 15 - 41 U/L   ALT 36 0 - 44 U/L   Alkaline Phosphatase 50 38 - 126 U/L   Total Bilirubin 0.9 0.3 - 1.2 mg/dL   GFR calc non Af Amer >60 >60 mL/min   GFR calc Af Amer >60 >60 mL/min   Anion gap 13 5 - 15  Lactic acid, plasma  Result Value Ref Range   Lactic Acid, Venous 0.9 0.5 - 1.9 mmol/L  Lactic acid, plasma  Result Value Ref Range   Lactic Acid, Venous 1.3 0.5 - 1.9 mmol/L  CBC with Differential  Result Value Ref Range   WBC 5.1 4.0 - 10.5 K/uL   RBC 5.78 4.22 - 5.81 MIL/uL   Hemoglobin 18.0 (H) 13.0 - 17.0 g/dL   HCT 62.5 39 - 52 %   MCV 86.9 80.0 - 100.0 fL   MCH 31.1 26.0 - 34.0 pg   MCHC  35.9 30.0 - 36.0 g/dL   RDW 16.5 53.7 - 48.2  %   Platelets 163 150 - 400 K/uL   nRBC 0.0 0.0 - 0.2 %   Neutrophils Relative % 82 %   Neutro Abs 4.1 1.7 - 7.7 K/uL   Lymphocytes Relative 14 %   Lymphs Abs 0.7 0.7 - 4.0 K/uL   Monocytes Relative 4 %   Monocytes Absolute 0.2 0 - 1 K/uL   Eosinophils Relative 0 %   Eosinophils Absolute 0.0 0 - 0 K/uL   Basophils Relative 0 %   Basophils Absolute 0.0 0 - 0 K/uL   Immature Granulocytes 0 %   Abs Immature Granulocytes 0.02 0.00 - 0.07 K/uL  Protime-INR  Result Value Ref Range   Prothrombin Time 12.4 11.4 - 15.2 seconds   INR 1.0 0.8 - 1.2  Procalcitonin - Baseline  Result Value Ref Range   Procalcitonin 0.34 ng/mL  Procalcitonin  Result Value Ref Range   Procalcitonin 0.16 ng/mL  HIV Antibody (routine testing w rflx)  Result Value Ref Range   HIV Screen 4th Generation wRfx Non Reactive Non Reactive  C-reactive protein  Result Value Ref Range   CRP <0.5 <1.0 mg/dL  Fibrin derivatives D-Dimer ([facility] only)  Result Value Ref Range   Fibrin derivatives D-dimer ([facility]) 1,097.24 (H) 0.00 - 499.00 ng/mL (FEU)  Procalcitonin  Result Value Ref Range   Procalcitonin 0.10 ng/mL  C-reactive protein  Result Value Ref Range   CRP 0.7 <1.0 mg/dL  Fibrin derivatives D-Dimer ([facility] only)  Result Value Ref Range   Fibrin derivatives D-dimer ([facility]) 729.22 (H) 0.00 - 499.00 ng/mL (FEU)  C-reactive protein  Result Value Ref Range   CRP 0.5 <1.0 mg/dL  Fibrin derivatives D-Dimer ([facility] only)  Result Value Ref Range   Fibrin derivatives D-dimer ([facility]) 507.60 (H) 0.00 - 499.00 ng/mL (FEU)  CBC  Result Value Ref Range   WBC 4.6 4.0 - 10.5 K/uL   RBC 4.92 4.22 - 5.81 MIL/uL   Hemoglobin 15.6 13.0 - 17.0 g/dL   HCT 70.7 39 - 52 %   MCV 89.8 80.0 - 100.0 fL   MCH 31.7 26.0 - 34.0 pg   MCHC 35.3 30.0 - 36.0 g/dL   RDW 86.7 54.4 - 92.0 %   Platelets 195 150 - 400 K/uL   nRBC 0.0 0.0 - 0.2 %  Comprehensive metabolic panel  Result Value Ref Range   Sodium 138 135 - 145 mmol/L   Potassium  4.3 3.5 - 5.1 mmol/L   Chloride 103 98 - 111 mmol/L   CO2 27 22 - 32 mmol/L   Glucose, Bld 149 (H) 70 - 99 mg/dL   BUN 16 6 - 20 mg/dL   Creatinine, Ser 1.00 0.61 - 1.24 mg/dL   Calcium 8.2 (L) 8.9 - 10.3 mg/dL   Total Protein 6.5 6.5 - 8.1 g/dL   Albumin 3.1 (L) 3.5 - 5.0 g/dL   AST 62 (H) 15 - 41 U/L   ALT 73 (H) 0 - 44 U/L   Alkaline Phosphatase 50 38 - 126 U/L   Total Bilirubin 0.8 0.3 - 1.2 mg/dL   GFR calc non Af Amer >60 >60 mL/min   GFR calc Af Amer >60 >60 mL/min   Anion gap 8 5 - 15  C-reactive protein  Result Value Ref Range   CRP <0.5 <1.0 mg/dL  Fibrin derivatives D-Dimer ([facility] only)  Result Value Ref Range   Fibrin derivatives D-dimer ([facility]) 315.64 0.00 -  499.00 ng/mL (FEU)  CBC  Result Value Ref Range   WBC 7.6 4.0 - 10.5 K/uL   RBC 5.10 4.22 - 5.81 MIL/uL   Hemoglobin 16.3 13.0 - 17.0 g/dL   HCT 16.144.2 39 - 52 %   MCV 86.7 80.0 - 100.0 fL   MCH 32.0 26.0 - 34.0 pg   MCHC 36.9 (H) 30.0 - 36.0 g/dL   RDW 09.611.9 04.511.5 - 40.915.5 %   Platelets 253 150 - 400 K/uL   nRBC 0.0 0.0 - 0.2 %  Comprehensive metabolic panel  Result Value Ref Range   Sodium 136 135 - 145 mmol/L   Potassium 4.7 3.5 - 5.1 mmol/L   Chloride 106 98 - 111 mmol/L   CO2 25 22 - 32 mmol/L   Glucose, Bld 140 (H) 70 - 99 mg/dL   BUN 17 6 - 20 mg/dL   Creatinine, Ser 8.110.77 0.61 - 1.24 mg/dL   Calcium 8.5 (L) 8.9 - 10.3 mg/dL   Total Protein 6.4 (L) 6.5 - 8.1 g/dL   Albumin 3.1 (L) 3.5 - 5.0 g/dL   AST 914180 (H) 15 - 41 U/L   ALT 241 (H) 0 - 44 U/L   Alkaline Phosphatase 46 38 - 126 U/L   Total Bilirubin 1.0 0.3 - 1.2 mg/dL   GFR calc non Af Amer >60 >60 mL/min   GFR calc Af Amer >60 >60 mL/min   Anion gap 5 5 - 15  ABO/Rh  Result Value Ref Range   ABO/RH(D)      O POS Performed at [facility], [address] [place], [place] [address]   Troponin I (High Sensitivity)  Result Value Ref Range   Troponin I (High Sensitivity) 7 <18 ng/L  Troponin I (High Sensitivity)  Result Value Ref  Range   Troponin I (High Sensitivity) 4 <18 ng/L      Assessment & Plan:   Problem List Items Addressed This Visit      Respiratory   Post-viral cough syndrome - Primary    Continued post-viral cough with DOE and fatigue since Dx with COVID 06/09/2020.  Onset of symptoms 06/07/2020, [facility] 06/17/2020-06/21/2020.  Has been trying to increase his activity but has increased DOE when doing so.  Discussed slowly working to increase his exercise endurance and tolerance, coupled with increasing water intake, increasing protein intake and rest.  Will re-assess in 2 weeks to determine if has progressed enough to RTW or if needing additional time for recovery.        Other   COVID-19    Sx started 06/07/2020, dx 06/09/2020, hospitalized 06/17/20-06/21/2020  FMLA paperwork completed 07/04/2020, to RTC in 2 weeks for re-evalation         No orders of the defined types were placed in this encounter.   Follow up plan: Return in about 2 weeks (around 07/18/2020) for SOB F/U.   Charlaine DaltonNicole Marie Rashea Hoskie, FNP Family Nurse Practitioner [facility] 07/04/2020, 2:56 PM

## 2020-07-04 NOTE — Assessment & Plan Note (Signed)
Continued post-viral cough with DOE and fatigue since Dx with COVID 06/09/2020.  Onset of symptoms 06/07/2020, hospitalized at [facility] 06/17/2020-06/21/2020.  Has been trying to increase his activity but has increased DOE when doing so.  Discussed slowly working to increase his exercise endurance and tolerance, coupled with increasing water intake, increasing protein intake and rest.  Will re-assess in 2 weeks to determine if has progressed enough to RTW or if needing additional time for recovery.

## 2020-07-04 NOTE — Patient Instructions (Signed)
Work on increasing your endurance and exercise tolerance.  Increase your protein intake and water intake daily.  Can begin over the counter vitamin C, zinc and elderberry supplements to help with your immune support.  Start slow with increasing your exercise tolerance and walking.  Can start with walking up to 10 minutes a day, increasing this slowly over the next 2 weeks.  If you find that you are having increased shortness of breath, to slow down and listen to your body.  We will plan to see you back in 2 weeks for shortness of breath follow up visit  You will receive a survey after today's visit either digitally by e-mail or paper by USPS mail. Your experiences and feedback matter to [place].  Please respond so we know how we are doing as we provide care for you.  Call us with any questions/concerns/needs.  It is my goal to be available to you for your health concerns.  Thanks for choosing me to be a partner in your healthcare needs!  Charlaine Dalton, FNP-C Family Nurse Practitioner [facility] Phone: [phone]

## 2020-07-13 ENCOUNTER — Telehealth: Payer: Self-pay

## 2020-07-13 NOTE — Telephone Encounter (Signed)
Copied from CRM [id number]. Topic: General - Other >> Jul 13, 2020  1:48 PM Jaquita Rector A wrote: Reason for CRM: Patient called to inquire of Danielle Rankin about the paper work discussed at his last visit for Short Term Disability. Per patient they were faxed over on 07/12/20 and need to be returned ASAP they are waiting for them at the disability office. Forms were discussed at last visit a week ago. Any questions please call  Ph# [phone]

## 2020-07-13 NOTE — Telephone Encounter (Signed)
Paperwork was completed and faxed after visit.  Original copy was given to patient to take with him

## 2020-07-14 NOTE — Telephone Encounter (Signed)
I called and spoke with the patient and he informed me that the forms he is requesting now is short term disability through [employer]. I informed him that we request 72 hrs to fill out paperwork.

## 2020-07-17 NOTE — Telephone Encounter (Signed)
Paperwork given to CMA to fax and notify patient ready for pick up

## 2020-07-18 ENCOUNTER — Other Ambulatory Visit: Payer: Self-pay

## 2020-07-18 ENCOUNTER — Ambulatory Visit: Payer: BC Managed Care – PPO | Admitting: Family Medicine

## 2020-07-18 ENCOUNTER — Encounter: Payer: Self-pay | Admitting: Family Medicine

## 2020-07-18 VITALS — BP 127/62 | HR 76 | Temp 97.9°F | Resp 18 | Ht 67.0 in | Wt 186.4 lb

## 2020-07-18 DIAGNOSIS — R0602 Shortness of breath: Secondary | ICD-10-CM | POA: Insufficient documentation

## 2020-07-18 MED ORDER — ALBUTEROL SULFATE HFA 108 (90 BASE) MCG/ACT IN AERS: 2.0000 | INHALATION_SPRAY | Freq: Four times a day (QID) | RESPIRATORY_TRACT | 2 refills | Status: DC | PRN

## 2020-07-18 NOTE — Patient Instructions (Signed)
I have sent in a refill on your albuterol inhaler, to take as directed.  Keep the upcoming appointment with Dr. Belia Heman for pulmonary evaluation.  I have extended your work note until your visit with Dr. Belia Heman and for his input on pulmonary rehab and work restrictions/status  We will plan to see you back in 4 weeks for shortness of breath s/p COVID follow up visit  You will receive a survey after today's visit either digitally by e-mail or paper by [employer]. Your experiences and feedback matter to [place].  Please respond so we know how we are doing as we provide care for you.  Call us with any questions/concerns/needs.  It is my goal to be available to you for your health concerns.  Thanks for choosing me to be a partner in your healthcare needs!  Charlaine Dalton, FNP-C Family Nurse Practitioner [facility] Phone: [phone]

## 2020-07-18 NOTE — Progress Notes (Signed)
Subjective:    Patient ID: Alex Cruz, male    DOB: [birth year], 34 y.o.   MRN: [id number]  Alex Cruz is a 34 y.o. male presenting on 07/18/2020 for Shortness of Breath (pt experiencing SOB with exertion and tiredness, post COVID 06/09/20)   HPI  Alex Cruz presents to clinic for follow up on his post-viral cough syndrome and shortness of breath since his COVID diagnosis in August 2021.  Has had shortness of breath with ambulation, reports is able to lay flat for sleep, denies cough or difficulty breathing.  Has some intermittent shortness of breath at rest when sitting.  Has upcoming appointment with Dr. Belia Heman on 07/25/2020.  Has been using albuterol inhaler as needed, not more than 4x per week.    Depression screen PHQ 2/9 07/04/2020  Decreased Interest 0  Down, Depressed, Hopeless 0  PHQ - 2 Score 0    Social History   Tobacco Use  . Smoking status: Never Smoker  . Smokeless tobacco: Never Used  Vaping Use  . Vaping Use: Never used  Substance Use Topics  . Alcohol use: Not Currently    Comment: social  . Drug use: No    Review of Systems  Constitutional: Negative.   HENT: Negative.   Eyes: Negative.   Respiratory: Positive for shortness of breath. Negative for apnea, cough, choking, chest tightness, wheezing and stridor.   Cardiovascular: Negative.   Gastrointestinal: Negative.   Endocrine: Negative.   Genitourinary: Negative.   Musculoskeletal: Negative.   Skin: Negative.   Allergic/Immunologic: Negative.   Neurological: Negative.   Hematological: Negative.   Psychiatric/Behavioral: Negative.    Per HPI unless specifically indicated above     Objective:    BP 127/62 (BP Location: Right Arm, Patient Position: Sitting, Cuff Size: Normal)   Pulse 76   Temp 97.9 F (36.6 C) (Oral)   Resp 18   Ht 5\' 7"  (1.702 m)   Wt 186 lb 6.4 oz (84.6 kg)   SpO2 98%   BMI 29.19 kg/m   Wt Readings from Last 3 Encounters:  07/18/20 186 lb 6.4 oz (84.6 kg)    07/04/20 180 lb 12.8 oz (82 kg)  06/17/20 180 lb (81.6 kg)    Physical Exam Vitals reviewed.  Constitutional:      General: He is not in acute distress.    Appearance: Normal appearance. He is well-developed, well-groomed and overweight. He is not ill-appearing or toxic-appearing.  HENT:     Head: Normocephalic and atraumatic.     Nose:     Comments: 06/19/20 is in place, covering mouth and nose. Eyes:     General:        Right eye: No discharge.        Left eye: No discharge.     Extraocular Movements: Extraocular movements intact.     Conjunctiva/sclera: Conjunctivae normal.     Pupils: Pupils are equal, round, and reactive to light.  Cardiovascular:     Rate and Rhythm: Normal rate and regular rhythm.     Pulses: Normal pulses.     Heart sounds: Normal heart sounds. No murmur heard.  No friction rub. No gallop.   Pulmonary:     Effort: Pulmonary effort is normal. No respiratory distress.     Breath sounds: Normal breath sounds.  Musculoskeletal:     Right lower leg: No edema.     Left lower leg: No edema.  Skin:    General: Skin is warm and dry.  Capillary Refill: Capillary refill takes less than 2 seconds.  Neurological:     General: No focal deficit present.     Mental Status: He is alert and oriented to person, place, and time.  Psychiatric:        Attention and Perception: Attention and perception normal.        Mood and Affect: Mood and affect normal.        Speech: Speech normal.        Behavior: Behavior normal. Behavior is cooperative.        Thought Content: Thought content normal.        Cognition and Memory: Cognition and memory normal.    Results for orders placed or performed during the hospital encounter of 06/17/20  Culture, blood (Routine x 2)   Specimen: BLOOD  Result Value Ref Range   Specimen Description BLOOD LEFT FOREARM    Special Requests      BOTTLES DRAWN AEROBIC AND ANAEROBIC Blood Culture results may not be optimal due to an  excessive volume of blood received in culture bottles   Culture      NO GROWTH 5 DAYS Performed at [facility], [address] [place], [place] [address]    Report Status 06/22/2020 FINAL   Culture, blood (Routine x 2)   Specimen: BLOOD  Result Value Ref Range   Specimen Description BLOOD LEFT FOREARM    Special Requests      BOTTLES DRAWN AEROBIC AND ANAEROBIC Blood Culture results may not be optimal due to an excessive volume of blood received in culture bottles   Culture      NO GROWTH 5 DAYS Performed at [facility], [address] [place], [place] [address]    Report Status 06/22/2020 FINAL   Comprehensive metabolic panel  Result Value Ref Range   Sodium 129 (L) 135 - 145 mmol/L   Potassium 3.7 3.5 - 5.1 mmol/L   Chloride 94 (L) 98 - 111 mmol/L   CO2 22 22 - 32 mmol/L   Glucose, Bld 105 (H) 70 - 99 mg/dL   BUN 15 6 - 20 mg/dL   Creatinine, Ser 0.93 0.61 - 1.24 mg/dL   Calcium 8.5 (L) 8.9 - 10.3 mg/dL   Total Protein 7.7 6.5 - 8.1 g/dL   Albumin 3.8 3.5 - 5.0 g/dL   AST 58 (H) 15 - 41 U/L   ALT 36 0 - 44 U/L   Alkaline Phosphatase 50 38 - 126 U/L   Total Bilirubin 0.9 0.3 - 1.2 mg/dL   GFR calc non Af Amer >60 >60 mL/min   GFR calc Af Amer >60 >60 mL/min   Anion gap 13 5 - 15  Lactic acid, plasma  Result Value Ref Range   Lactic Acid, Venous 0.9 0.5 - 1.9 mmol/L  Lactic acid, plasma  Result Value Ref Range   Lactic Acid, Venous 1.3 0.5 - 1.9 mmol/L  CBC with Differential  Result Value Ref Range   WBC 5.1 4.0 - 10.5 K/uL   RBC 5.78 4.22 - 5.81 MIL/uL   Hemoglobin 18.0 (H) 13.0 - 17.0 g/dL   HCT 81.8 39 - 52 %   MCV 86.9 80.0 - 100.0 fL   MCH 31.1 26.0 - 34.0 pg   MCHC 35.9 30.0 - 36.0 g/dL   RDW 29.9 37.1 - 69.6 %   Platelets 163 150 - 400 K/uL   nRBC 0.0 0.0 - 0.2 %   Neutrophils Relative % 82 %  Neutro Abs 4.1 1.7 - 7.7 K/uL   Lymphocytes Relative 14 %   Lymphs Abs 0.7 0.7 - 4.0 K/uL   Monocytes Relative 4 %   Monocytes Absolute  0.2 0 - 1 K/uL   Eosinophils Relative 0 %   Eosinophils Absolute 0.0 0 - 0 K/uL   Basophils Relative 0 %   Basophils Absolute 0.0 0 - 0 K/uL   Immature Granulocytes 0 %   Abs Immature Granulocytes 0.02 0.00 - 0.07 K/uL  Protime-INR  Result Value Ref Range   Prothrombin Time 12.4 11.4 - 15.2 seconds   INR 1.0 0.8 - 1.2  Procalcitonin - Baseline  Result Value Ref Range   Procalcitonin 0.34 ng/mL  Procalcitonin  Result Value Ref Range   Procalcitonin 0.16 ng/mL  HIV Antibody (routine testing w rflx)  Result Value Ref Range   HIV Screen 4th Generation wRfx Non Reactive Non Reactive  C-reactive protein  Result Value Ref Range   CRP <0.5 <1.0 mg/dL  Fibrin derivatives D-Dimer ([facility] only)  Result Value Ref Range   Fibrin derivatives D-dimer ([facility]) 1,097.24 (H) 0.00 - 499.00 ng/mL (FEU)  Procalcitonin  Result Value Ref Range   Procalcitonin 0.10 ng/mL  C-reactive protein  Result Value Ref Range   CRP 0.7 <1.0 mg/dL  Fibrin derivatives D-Dimer ([facility] only)  Result Value Ref Range   Fibrin derivatives D-dimer ([facility]) 729.22 (H) 0.00 - 499.00 ng/mL (FEU)  C-reactive protein  Result Value Ref Range   CRP 0.5 <1.0 mg/dL  Fibrin derivatives D-Dimer ([facility] only)  Result Value Ref Range   Fibrin derivatives D-dimer ([facility]) 507.60 (H) 0.00 - 499.00 ng/mL (FEU)  CBC  Result Value Ref Range   WBC 4.6 4.0 - 10.5 K/uL   RBC 4.92 4.22 - 5.81 MIL/uL   Hemoglobin 15.6 13.0 - 17.0 g/dL   HCT 54.0 39 - 52 %   MCV 89.8 80.0 - 100.0 fL   MCH 31.7 26.0 - 34.0 pg   MCHC 35.3 30.0 - 36.0 g/dL   RDW 08.6 76.1 - 95.0 %   Platelets 195 150 - 400 K/uL   nRBC 0.0 0.0 - 0.2 %  Comprehensive metabolic panel  Result Value Ref Range   Sodium 138 135 - 145 mmol/L   Potassium 4.3 3.5 - 5.1 mmol/L   Chloride 103 98 - 111 mmol/L   CO2 27 22 - 32 mmol/L   Glucose, Bld 149 (H) 70 - 99 mg/dL   BUN 16 6 - 20 mg/dL   Creatinine, Ser 9.32 0.61 - 1.24 mg/dL   Calcium 8.2 (L) 8.9 - 10.3 mg/dL   Total Protein  6.5 6.5 - 8.1 g/dL   Albumin 3.1 (L) 3.5 - 5.0 g/dL   AST 62 (H) 15 - 41 U/L   ALT 73 (H) 0 - 44 U/L   Alkaline Phosphatase 50 38 - 126 U/L   Total Bilirubin 0.8 0.3 - 1.2 mg/dL   GFR calc non Af Amer >60 >60 mL/min   GFR calc Af Amer >60 >60 mL/min   Anion gap 8 5 - 15  C-reactive protein  Result Value Ref Range   CRP <0.5 <1.0 mg/dL  Fibrin derivatives D-Dimer ([facility] only)  Result Value Ref Range   Fibrin derivatives D-dimer ([facility]) 315.64 0.00 - 499.00 ng/mL (FEU)  CBC  Result Value Ref Range   WBC 7.6 4.0 - 10.5 K/uL   RBC 5.10 4.22 - 5.81 MIL/uL   Hemoglobin 16.3 13.0 - 17.0 g/dL   HCT 44.2  39 - 52 %   MCV 86.7 80.0 - 100.0 fL   MCH 32.0 26.0 - 34.0 pg   MCHC 36.9 (H) 30.0 - 36.0 g/dL   RDW 09.811.9 11.911.5 - 14.715.5 %   Platelets 253 150 - 400 K/uL   nRBC 0.0 0.0 - 0.2 %  Comprehensive metabolic panel  Result Value Ref Range   Sodium 136 135 - 145 mmol/L   Potassium 4.7 3.5 - 5.1 mmol/L   Chloride 106 98 - 111 mmol/L   CO2 25 22 - 32 mmol/L   Glucose, Bld 140 (H) 70 - 99 mg/dL   BUN 17 6 - 20 mg/dL   Creatinine, Ser 8.290.77 0.61 - 1.24 mg/dL   Calcium 8.5 (L) 8.9 - 10.3 mg/dL   Total Protein 6.4 (L) 6.5 - 8.1 g/dL   Albumin 3.1 (L) 3.5 - 5.0 g/dL   AST 562180 (H) 15 - 41 U/L   ALT 241 (H) 0 - 44 U/L   Alkaline Phosphatase 46 38 - 126 U/L   Total Bilirubin 1.0 0.3 - 1.2 mg/dL   GFR calc non Af Amer >60 >60 mL/min   GFR calc Af Amer >60 >60 mL/min   Anion gap 5 5 - 15  ABO/Rh  Result Value Ref Range   ABO/RH(D)      O POS Performed at [facility], [address] [place], [place] [address]   Troponin I (High Sensitivity)  Result Value Ref Range   Troponin I (High Sensitivity) 7 <18 ng/L  Troponin I (High Sensitivity)  Result Value Ref Range   Troponin I (High Sensitivity) 4 <18 ng/L      Assessment & Plan:   Problem List Items Addressed This Visit      Other   Shortness of breath - Primary    Reported continued shortness of breath with exertion and  fatigue.  Has upcoming appointment with Dr. Belia HemanKasa in Pulmonary for evaluation s/p COVID infection from 05/2020.  Discussed concerns for possible pulmonary rehab but will defer to pulmonology if needed.  Will keep out of work until follows with Dr. Belia HemanKasa and will send albuterol HFA refill in.  Plan: 1. Albuterol HFA refill sent to pharmacy on file 2. Keep upcoming appointment with Dr. Belia HemanKasa in Pulmonology 3. RTC in 4 weeks      Relevant Medications   albuterol (VENTOLIN HFA) 108 (90 Base) MCG/ACT inhaler      Meds ordered this encounter  Medications  . albuterol (VENTOLIN HFA) 108 (90 Base) MCG/ACT inhaler    Sig: Inhale 2 puffs into the lungs every 6 (six) hours as needed for wheezing or shortness of breath.    Dispense:  8 g    Refill:  2    Follow up plan: Return in about 4 weeks (around 08/15/2020) for SOB with exertion f/u.   Charlaine DaltonNicole Marie Adriahna Shearman, FNP Family Nurse Practitioner [facility] 07/18/2020, 10:23 AM

## 2020-07-18 NOTE — Assessment & Plan Note (Signed)
Reported continued shortness of breath with exertion and fatigue.  Has upcoming appointment with Dr. Belia Heman in Pulmonary for evaluation s/p COVID infection from 05/2020.  Discussed concerns for possible pulmonary rehab but will defer to pulmonology if needed.  Will keep out of work until follows with Dr. Belia Heman and will send albuterol HFA refill in.  Plan: 1. Albuterol HFA refill sent to pharmacy on file 2. Keep upcoming appointment with Dr. Belia Heman in Pulmonology 3. RTC in 4 weeks

## 2020-07-24 ENCOUNTER — Telehealth: Payer: Self-pay | Admitting: Family Medicine

## 2020-07-24 NOTE — Telephone Encounter (Signed)
Any idea on what he means the forms were completed incorrectly?  Does [employer] have a copy of what they want changed/updated?  Have we received new forms?

## 2020-07-24 NOTE — Telephone Encounter (Signed)
The new form is in your box with the patient letter & positive test result for the date they are requesting.

## 2020-07-24 NOTE — Telephone Encounter (Signed)
Patient is calling regarding forms that where faxed from [employer] - there forms where completed incorrectly and it has delayed the patients disability. And [employer] will resend the docs. Can Joni Reining check that the patient was put out of work and resend please? Please advise CB- [phone] Patientis requesting a Call Back when docs have been resent back to [employer]. Patient states that he has had a hard time processing the claim.

## 2020-07-25 ENCOUNTER — Other Ambulatory Visit: Payer: Self-pay

## 2020-07-25 ENCOUNTER — Encounter: Payer: Self-pay | Admitting: Internal Medicine

## 2020-07-25 ENCOUNTER — Ambulatory Visit: Payer: BC Managed Care – PPO | Admitting: Internal Medicine

## 2020-07-25 VITALS — BP 108/78 | HR 78 | Temp 98.2°F | Ht 67.0 in | Wt 184.2 lb

## 2020-07-25 DIAGNOSIS — J4540 Moderate persistent asthma, uncomplicated: Secondary | ICD-10-CM

## 2020-07-25 DIAGNOSIS — U071 COVID-19: Secondary | ICD-10-CM | POA: Diagnosis not present

## 2020-07-25 MED ORDER — ADVAIR HFA 230-21 MCG/ACT IN AERO: 2.0000 | INHALATION_SPRAY | Freq: Two times a day (BID) | RESPIRATORY_TRACT | 12 refills | Status: DC

## 2020-07-25 NOTE — Telephone Encounter (Signed)
Updated, signed and faxed

## 2020-07-25 NOTE — Progress Notes (Signed)
Name: Alex Cruz MRN: [id number] DOB: [birth year]     CONSULTATION DATE: 07/25/2020  REFERRING MD : Ethelene Hal  CHIEF COMPLAINT: COVID 19 infection   STUDIES:     05/2020 CXR independently reviewed by Me diffuse b/l interstitial infiltrates    HISTORY OF PRESENT ILLNESS: 34 yo male with recent admission for COVID 19 pneumonia 06/17/2020 Diagnosed with  Acute hypoxic respiratory failure secondary to COVID-19 pneumonia.   The patient qualified for home oxygen.  Completed 5 days of remdesivir/steroids Discharged with vitamin C and zinc and an albuterol inhaler.   Patient also had diagnosis of Hyponatremia with elevated liver function tests   Currently, patient still has coughing spells intermittent wheezing with shortness of breath and dyspnea exertion Patient has difficulty walking his dog Patient denies any fevers at this time Patient is not in any distress  I have explained to him that he is lucky to be alive due to his COVID-19 infection Patient has a previous history and diagnosis of asthma Patient last asthma attack that required hospitalization was at the age of 8 Patient used intermittent inhalers as a child  Chest x-ray reviewed with patient there is bilateral interstitial infiltrates signifying significant lung disease and damage I have explained to the patient his symptoms could persist indefinitely everyone recovers differently     PAST MEDICAL HISTORY :   has a past medical history of COVID-19 (06/10/2020) and No known health problems.  has no past surgical history on file. Prior to Admission medications   Medication Sig Start Date End Date Taking? Authorizing Provider  albuterol (VENTOLIN HFA) 108 (90 Base) MCG/ACT inhaler Inhale 2 puffs into the lungs every 6 (six) hours as needed for wheezing or shortness of breath. 07/18/20   Malfi, Jodelle Gross, FNP  vitamin C (ASCORBIC ACID) 250 MG tablet Take 1 tablet (250 mg total) by mouth 2 (two) times daily. 06/21/20    Alford Highland, MD  Zinc 220 (50 Zn) MG CAPS Take 220 mg by mouth daily. 06/21/20   Alford Highland, MD   No Known Allergies  FAMILY HISTORY:  family history includes Healthy in his mother; Heart attack in his father; Hypertension in his father. SOCIAL HISTORY:  reports that he has never smoked. He has never used smokeless tobacco. He reports previous alcohol use. He reports that he does not use drugs.    Review of Systems:  Gen:  Denies  fever, sweats, chills weigh loss  HEENT: Denies blurred vision, double vision, ear pain, eye pain, hearing loss, nose bleeds, sore throat Cardiac:  No dizziness, chest pain or heaviness, chest tightness,edema, No JVD Resp:   + cough, -sputum production, +shortness of breath,+wheezing, -hemoptysis,  Gi: Denies swallowing difficulty, stomach pain, nausea or vomiting, diarrhea, constipation, bowel incontinence Gu:  Denies bladder incontinence, burning urine Ext:   Denies Joint pain, stiffness or swelling Skin: Denies  skin rash, easy bruising or bleeding or hives Endoc:  Denies polyuria, polydipsia , polyphagia or weight change Psych:   Denies depression, insomnia or hallucinations  Other:  All other systems negative   BP 108/78 (BP Location: Left Arm, Patient Position: Sitting, Cuff Size: Normal)   Pulse 78   Temp 98.2 F (36.8 C) (Temporal)   Ht 5\' 7"  (1.702 m)   Wt 184 lb 3.2 oz (83.6 kg)   SpO2 97%   BMI 28.85 kg/m    Physical Examination:   GENERAL:NAD, no fevers, chills, no weakness no fatigue HEAD: Normocephalic, atraumatic.  EYES: PERLA, EOMI No scleral  icterus.  MOUTH: Moist mucosal membrane.  EAR, NOSE, THROAT: Clear without exudates. No external lesions.  NECK: Supple.  PULMONARY: CTA B/L no wheezing, rhonchi, crackles CARDIOVASCULAR: S1 and S2. Regular rate and rhythm. No murmurs GASTROINTESTINAL: Soft, nontender, nondistended. Positive bowel sounds.  MUSCULOSKELETAL: No swelling, clubbing, or edema.  NEUROLOGIC: No gross  focal neurological deficits. 5/5 strength all extremities SKIN: No ulceration, lesions, rashes, or cyanosis.  PSYCHIATRIC: Insight, judgment intact. -depression -anxiety ALL OTHER ROS ARE NEGATIVE   MEDICATIONS: I have reviewed all medications and confirmed regimen as documented       ASSESSMENT AND PLAN SYNOPSIS  34 year old male with recent infection with COVID-19 pneumonia with bilateral interstitial infiltrates discharged on home oxygen therapy seen today for evaluation for post Covid pneumonia and pneumonitis  At this time patient has a history and diagnosis of asthma Patient with moderate persistent asthma status post COVID-19 infection pneumonitis We will start Advair HFA 230 mcg 2 puffs twice daily Albuterol as needed We will consider prednisone therapy if symptoms do not improve with inhaled steroids  Hypoxic respiratory failure Patient will need 6-minute walk test to assess for exertional hypoxia Patient will need tight pulse oximetry to assess for nocturnal hypoxia  Pulmonary hygiene Recommend exercise as tolerated Recommend incentive spirometry which she has at home Use more frequently   COVID-19 EDUCATION: The signs and symptoms of COVID-19 were discussed with the patient and how to seek care for testing.  The importance of social distancing was discussed today. Hand Washing Techniques and avoid touching face was advised.     MEDICATION ADJUSTMENTS/LABS AND TESTS ORDERED: Start Advair HFA Albuterol as needed Incentive spirometry more frequently Exercise as tolerated   CURRENT MEDICATIONS REVIEWED AT LENGTH WITH PATIENT TODAY   Patient satisfied with Plan of action and management. All questions answered  Follow up in 6 months  TOTAL TIME SPENT 48 mins  Wallis Bamberg Santiago Glad, M.D.  [facility]  Medical Director [facility] Medical Director [facility] Cardio-Pulmonary Department

## 2020-07-25 NOTE — Patient Instructions (Addendum)
START ADVIAR HFA 230 2 puffs twice daily Continue ALBUTEROL AS NEEDED  Check and check ONN  Continue breathing exercises and exercise as tolerated

## 2020-08-03 ENCOUNTER — Other Ambulatory Visit: Payer: Self-pay

## 2020-08-03 ENCOUNTER — Ambulatory Visit (INDEPENDENT_AMBULATORY_CARE_PROVIDER_SITE_OTHER): Payer: BC Managed Care – PPO

## 2020-08-03 DIAGNOSIS — R0609 Other forms of dyspnea: Secondary | ICD-10-CM

## 2020-08-03 DIAGNOSIS — R0600 Dyspnea, unspecified: Secondary | ICD-10-CM

## 2020-08-03 DIAGNOSIS — U071 COVID-19: Secondary | ICD-10-CM

## 2020-08-03 DIAGNOSIS — J4540 Moderate persistent asthma, uncomplicated: Secondary | ICD-10-CM

## 2020-08-15 ENCOUNTER — Other Ambulatory Visit: Payer: Self-pay

## 2020-08-15 ENCOUNTER — Encounter: Payer: Self-pay | Admitting: Family Medicine

## 2020-08-15 ENCOUNTER — Ambulatory Visit (INDEPENDENT_AMBULATORY_CARE_PROVIDER_SITE_OTHER): Payer: BC Managed Care – PPO | Admitting: Family Medicine

## 2020-08-15 VITALS — BP 126/72 | HR 64 | Temp 97.8°F | Resp 18 | Ht 67.0 in | Wt 187.0 lb

## 2020-08-15 DIAGNOSIS — R0602 Shortness of breath: Secondary | ICD-10-CM | POA: Diagnosis not present

## 2020-08-15 DIAGNOSIS — U099 Post covid-19 condition, unspecified: Secondary | ICD-10-CM | POA: Insufficient documentation

## 2020-08-15 MED ORDER — MONTELUKAST SODIUM 10 MG PO TABS: 10.0000 mg | ORAL_TABLET | Freq: Every day | ORAL | 3 refills | Status: DC

## 2020-08-15 NOTE — Progress Notes (Signed)
Subjective:    Patient ID: Alex Cruz, male    DOB: 03/11/1986, 34 y.o.   MRN: [id number]  Alex Cruz is a 34 y.o. male presenting on 08/15/2020 for Shortness of Breath (pt was seen by the pulmonologist x 2weeks ago. Prescribe a Advair HFA inhaler but didn't get the prescription filled because of the cost.)   HPI  [name] presents to clinic for a follow up on his shortness of breath and post-COVID syndrome.  Reports he has met with pulmonary and they sent in a prescription for Advair but it had a high copayment and he has not yet started this.  Reports he had to have a walk test and is waiting to do a home oxygen test from pulmonary.  Has continued shortness of breath and fatigue with minimal exertion.  Has been using his albuterol inhaler as needed with some improvement of symptom exacerbation.  Depression screen PHQ 2/9 07/04/2020  Decreased Interest 0  Down, Depressed, Hopeless 0  PHQ - 2 Score 0    Social History   Tobacco Use  . Smoking status: Never Smoker  . Smokeless tobacco: Never Used  Vaping Use  . Vaping Use: Never used  Substance Use Topics  . Alcohol use: Not Currently    Comment: social  . Drug use: No    Review of Systems  Constitutional: Positive for fatigue. Negative for activity change, appetite change, chills, diaphoresis, fever and unexpected weight change.  HENT: Negative.   Eyes: Negative.   Respiratory: Positive for cough and shortness of breath. Negative for apnea, choking, chest tightness, wheezing and stridor.   Cardiovascular: Negative.   Gastrointestinal: Negative.   Endocrine: Negative.   Genitourinary: Negative.   Musculoskeletal: Negative.   Skin: Negative.   Allergic/Immunologic: Negative.   Neurological: Negative.   Hematological: Negative.   Psychiatric/Behavioral: Negative.    Per HPI unless specifically indicated above     Objective:    BP 126/72 (BP Location: Left Arm, Patient Position: Sitting, Cuff Size: Normal)    Pulse 64   Temp 97.8 F (36.6 C) (Oral)   Resp 18   Ht 5' 7"  (1.702 m)   Wt 187 lb (84.8 kg)   SpO2 100%   BMI 29.29 kg/m   Wt Readings from Last 3 Encounters:  08/15/20 187 lb (84.8 kg)  07/25/20 184 lb 3.2 oz (83.6 kg)  07/18/20 186 lb 6.4 oz (84.6 kg)    Physical Exam Vitals and nursing note reviewed.  Constitutional:      General: He is not in acute distress.    Appearance: Normal appearance. He is well-developed and well-groomed. He is not ill-appearing or toxic-appearing.  HENT:     Head: Normocephalic and atraumatic.     Nose:     Comments: Alex Cruz is in place, covering mouth and nose. Eyes:     General:        Right eye: No discharge.        Left eye: No discharge.     Extraocular Movements: Extraocular movements intact.     Conjunctiva/sclera: Conjunctivae normal.     Pupils: Pupils are equal, round, and reactive to light.  Cardiovascular:     Rate and Rhythm: Normal rate and regular rhythm.     Pulses: Normal pulses.     Heart sounds: Normal heart sounds. No murmur heard.  No friction rub. No gallop.   Pulmonary:     Effort: Pulmonary effort is normal. No respiratory distress.  Breath sounds: Normal breath sounds.  Skin:    General: Skin is warm and dry.     Capillary Refill: Capillary refill takes less than 2 seconds.  Neurological:     General: No focal deficit present.     Mental Status: He is alert and oriented to person, place, and time.  Psychiatric:        Attention and Perception: Attention and perception normal.        Mood and Affect: Mood and affect normal.        Speech: Speech normal.        Behavior: Behavior normal. Behavior is cooperative.        Thought Content: Thought content normal.        Cognition and Memory: Cognition and memory normal.    Results for orders placed or performed during the hospital encounter of 06/17/20  Culture, blood (Routine x 2)   Specimen: BLOOD  Result Value Ref Range   Specimen Description BLOOD  LEFT FOREARM    Special Requests      BOTTLES DRAWN AEROBIC AND ANAEROBIC Blood Culture results may not be optimal due to an excessive volume of blood received in culture bottles   Culture      NO GROWTH 5 DAYS Performed at [facility], [address] [place], [place] [address]    Report Status 06/22/2020 FINAL   Culture, blood (Routine x 2)   Specimen: BLOOD  Result Value Ref Range   Specimen Description BLOOD LEFT FOREARM    Special Requests      BOTTLES DRAWN AEROBIC AND ANAEROBIC Blood Culture results may not be optimal due to an excessive volume of blood received in culture bottles   Culture      NO GROWTH 5 DAYS Performed at [facility], [address] [place], [place] [address]    Report Status 06/22/2020 FINAL   Comprehensive metabolic panel  Result Value Ref Range   Sodium 129 (L) 135 - 145 mmol/L   Potassium 3.7 3.5 - 5.1 mmol/L   Chloride 94 (L) 98 - 111 mmol/L   CO2 22 22 - 32 mmol/L   Glucose, Bld 105 (H) 70 - 99 mg/dL   BUN 15 6 - 20 mg/dL   Creatinine, Ser 0.90 0.61 - 1.24 mg/dL   Calcium 8.5 (L) 8.9 - 10.3 mg/dL   Total Protein 7.7 6.5 - 8.1 g/dL   Albumin 3.8 3.5 - 5.0 g/dL   AST 58 (H) 15 - 41 U/L   ALT 36 0 - 44 U/L   Alkaline Phosphatase 50 38 - 126 U/L   Total Bilirubin 0.9 0.3 - 1.2 mg/dL   GFR calc non Af Amer >60 >60 mL/min   GFR calc Af Amer >60 >60 mL/min   Anion gap 13 5 - 15  Lactic acid, plasma  Result Value Ref Range   Lactic Acid, Venous 0.9 0.5 - 1.9 mmol/L  Lactic acid, plasma  Result Value Ref Range   Lactic Acid, Venous 1.3 0.5 - 1.9 mmol/L  CBC with Differential  Result Value Ref Range   WBC 5.1 4.0 - 10.5 K/uL   RBC 5.78 4.22 - 5.81 MIL/uL   Hemoglobin 18.0 (H) 13.0 - 17.0 g/dL   HCT 50.2 39 - 52 %   MCV 86.9 80.0 - 100.0 fL   MCH 31.1 26.0 - 34.0 pg   MCHC 35.9 30.0 - 36.0 g/dL   RDW 12.0 11.5 - 15.5 %   Platelets 163 150 -  400 K/uL   nRBC 0.0 0.0 - 0.2 %   Neutrophils Relative % 82 %   Neutro Abs  4.1 1.7 - 7.7 K/uL   Lymphocytes Relative 14 %   Lymphs Abs 0.7 0.7 - 4.0 K/uL   Monocytes Relative 4 %   Monocytes Absolute 0.2 0.1 - 1.0 K/uL   Eosinophils Relative 0 %   Eosinophils Absolute 0.0 0.0 - 0.5 K/uL   Basophils Relative 0 %   Basophils Absolute 0.0 0.0 - 0.1 K/uL   Immature Granulocytes 0 %   Abs Immature Granulocytes 0.02 0.00 - 0.07 K/uL  Protime-INR  Result Value Ref Range   Prothrombin Time 12.4 11.4 - 15.2 seconds   INR 1.0 0.8 - 1.2  Procalcitonin - Baseline  Result Value Ref Range   Procalcitonin 0.34 ng/mL  Procalcitonin  Result Value Ref Range   Procalcitonin 0.16 ng/mL  HIV Antibody (routine testing w rflx)  Result Value Ref Range   HIV Screen 4th Generation wRfx Non Reactive Non Reactive  C-reactive protein  Result Value Ref Range   CRP <0.5 <1.0 mg/dL  Fibrin derivatives D-Dimer ([facility] only)  Result Value Ref Range   Fibrin derivatives D-dimer ([facility]) 1,097.24 (H) 0.00 - 499.00 ng/mL (FEU)  Procalcitonin  Result Value Ref Range   Procalcitonin 0.10 ng/mL  C-reactive protein  Result Value Ref Range   CRP 0.7 <1.0 mg/dL  Fibrin derivatives D-Dimer ([facility] only)  Result Value Ref Range   Fibrin derivatives D-dimer ([facility]) 729.22 (H) 0.00 - 499.00 ng/mL (FEU)  C-reactive protein  Result Value Ref Range   CRP 0.5 <1.0 mg/dL  Fibrin derivatives D-Dimer ([facility] only)  Result Value Ref Range   Fibrin derivatives D-dimer ([facility]) 507.60 (H) 0.00 - 499.00 ng/mL (FEU)  CBC  Result Value Ref Range   WBC 4.6 4.0 - 10.5 K/uL   RBC 4.92 4.22 - 5.81 MIL/uL   Hemoglobin 15.6 13.0 - 17.0 g/dL   HCT 44.2 39 - 52 %   MCV 89.8 80.0 - 100.0 fL   MCH 31.7 26.0 - 34.0 pg   MCHC 35.3 30.0 - 36.0 g/dL   RDW 12.0 11.5 - 15.5 %   Platelets 195 150 - 400 K/uL   nRBC 0.0 0.0 - 0.2 %  Comprehensive metabolic panel  Result Value Ref Range   Sodium 138 135 - 145 mmol/L   Potassium 4.3 3.5 - 5.1 mmol/L   Chloride 103 98 - 111 mmol/L   CO2 27 22 - 32 mmol/L   Glucose, Bld  149 (H) 70 - 99 mg/dL   BUN 16 6 - 20 mg/dL   Creatinine, Ser 0.62 0.61 - 1.24 mg/dL   Calcium 8.2 (L) 8.9 - 10.3 mg/dL   Total Protein 6.5 6.5 - 8.1 g/dL   Albumin 3.1 (L) 3.5 - 5.0 g/dL   AST 62 (H) 15 - 41 U/L   ALT 73 (H) 0 - 44 U/L   Alkaline Phosphatase 50 38 - 126 U/L   Total Bilirubin 0.8 0.3 - 1.2 mg/dL   GFR calc non Af Amer >60 >60 mL/min   GFR calc Af Amer >60 >60 mL/min   Anion gap 8 5 - 15  C-reactive protein  Result Value Ref Range   CRP <0.5 <1.0 mg/dL  Fibrin derivatives D-Dimer ([facility] only)  Result Value Ref Range   Fibrin derivatives D-dimer ([facility]) 315.64 0.00 - 499.00 ng/mL (FEU)  CBC  Result Value Ref Range   WBC 7.6 4.0 - 10.5 K/uL  RBC 5.10 4.22 - 5.81 MIL/uL   Hemoglobin 16.3 13.0 - 17.0 g/dL   HCT 44.2 39 - 52 %   MCV 86.7 80.0 - 100.0 fL   MCH 32.0 26.0 - 34.0 pg   MCHC 36.9 (H) 30.0 - 36.0 g/dL   RDW 11.9 11.5 - 15.5 %   Platelets 253 150 - 400 K/uL   nRBC 0.0 0.0 - 0.2 %  Comprehensive metabolic panel  Result Value Ref Range   Sodium 136 135 - 145 mmol/L   Potassium 4.7 3.5 - 5.1 mmol/L   Chloride 106 98 - 111 mmol/L   CO2 25 22 - 32 mmol/L   Glucose, Bld 140 (H) 70 - 99 mg/dL   BUN 17 6 - 20 mg/dL   Creatinine, Ser 0.77 0.61 - 1.24 mg/dL   Calcium 8.5 (L) 8.9 - 10.3 mg/dL   Total Protein 6.4 (L) 6.5 - 8.1 g/dL   Albumin 3.1 (L) 3.5 - 5.0 g/dL   AST 180 (H) 15 - 41 U/L   ALT 241 (H) 0 - 44 U/L   Alkaline Phosphatase 46 38 - 126 U/L   Total Bilirubin 1.0 0.3 - 1.2 mg/dL   GFR calc non Af Amer >60 >60 mL/min   GFR calc Af Amer >60 >60 mL/min   Anion gap 5 5 - 15  ABO/Rh  Result Value Ref Range   ABO/RH(D)      O POS Performed at [facility], [address] [place], [place] [address]   Troponin I (High Sensitivity)  Result Value Ref Range   Troponin I (High Sensitivity) 7 <18 ng/L  Troponin I (High Sensitivity)  Result Value Ref Range   Troponin I (High Sensitivity) 4 <18 ng/L      Assessment & Plan:   Problem List  Items Addressed This Visit      Other   Shortness of breath   Relevant Medications   montelukast (SINGULAIR) 10 MG tablet   Post-COVID syndrome - Primary    Continued fatigue and shortness of breath.  To call insurance for information on their formulary so we can change his advair to formulary preferred.  To start on singulair 33m nightly.  Educated to work on slowly building up endurance and tolerance.  Can use albuterol inhaler 1-2 puffs every 4-6 hours as needed for shortness of breath/cough/wheezing.  Will continue out of work for another 4 weeks while works on building up endurance and for hopeful reduction in post-COVID symptoms.  Plan: 1. Start singulair 162mnightly 2. Contact insurance and obtain copy of formulary, provide [place] this information and I will send in alternative for advair 3. FMLA paperwork to be completed once received 4. RTC in 4 weeks for next appointment      Relevant Medications   montelukast (SINGULAIR) 10 MG tablet      Meds ordered this encounter  Medications  . montelukast (SINGULAIR) 10 MG tablet    Sig: Take 1 tablet (10 mg total) by mouth at bedtime.    Dispense:  90 tablet    Refill:  3    Follow up plan: Return in about 4 weeks (around 09/12/2020).   NiHarlin RainFNAshtonamily Nurse Practitioner [facility] 08/15/2020, 9:33 AM

## 2020-08-15 NOTE — Assessment & Plan Note (Signed)
Continued fatigue and shortness of breath.  To call insurance for information on their formulary so we can change his advair to formulary preferred.  To start on singulair 10mg  nightly.  Educated to work on slowly building up endurance and tolerance.  Can use albuterol inhaler 1-2 puffs every 4-6 hours as needed for shortness of breath/cough/wheezing.  Will continue out of work for another 4 weeks while works on building up endurance and for hopeful reduction in post-COVID symptoms.  Plan: 1. Start singulair 10mg  nightly 2. Contact insurance and obtain copy of formulary, provide with this information and I will send in alternative for advair 3. FMLA paperwork to be completed once received 4. RTC in 4 weeks for next appointment

## 2020-08-15 NOTE — Patient Instructions (Signed)
I have sent in a prescription for singulair to take 1 tablet nightly.  Please contact your insurance company to find out their formulary so I can send in an alternative for your Advair inhaler.  Please have your company send in your FMLA paperwork and I will get an updated copy completed for them.  Continue your medications as directed.  Be sure you are slowly increasing your exercise tolerance daily.  Begin to try and walk a bit more, do a bit more every day, as tolerated to help your lungs on your recovery.  We will plan to see you back in 4 weeks for post-covid shortness of breath follow up visit  You will receive a survey after today's visit either digitally by e-mail or paper by USPS mail. Your experiences and feedback matter to [place].  Please respond so we know how we are doing as we provide care for you.  Call us with any questions/concerns/needs.  It is my goal to be available to you for your health concerns.  Thanks for choosing me to be a partner in your healthcare needs!  Charlaine Dalton, FNP-C Family Nurse Practitioner [facility] Phone: [phone]

## 2020-09-18 ENCOUNTER — Ambulatory Visit
Admission: RE | Admit: 2020-09-18 | Discharge: 2020-09-18 | Disposition: A | Discharge status: Home / Self Care | Payer: BC Managed Care – PPO | Source: Ambulatory Visit | Attending: Family Medicine | Admitting: Family Medicine

## 2020-09-18 ENCOUNTER — Other Ambulatory Visit: Payer: Self-pay

## 2020-09-18 ENCOUNTER — Ambulatory Visit (INDEPENDENT_AMBULATORY_CARE_PROVIDER_SITE_OTHER): Payer: BC Managed Care – PPO | Admitting: Family Medicine

## 2020-09-18 ENCOUNTER — Encounter: Payer: Self-pay | Admitting: Family Medicine

## 2020-09-18 VITALS — BP 119/69 | HR 88 | Temp 98.1°F | Resp 17 | Ht 67.0 in | Wt 184.8 lb

## 2020-09-18 DIAGNOSIS — R059 Cough, unspecified: Secondary | ICD-10-CM | POA: Diagnosis not present

## 2020-09-18 DIAGNOSIS — R748 Abnormal levels of other serum enzymes: Secondary | ICD-10-CM | POA: Insufficient documentation

## 2020-09-18 DIAGNOSIS — R899 Unspecified abnormal finding in specimens from other organs, systems and tissues: Secondary | ICD-10-CM | POA: Insufficient documentation

## 2020-09-18 DIAGNOSIS — J90 Pleural effusion, not elsewhere classified: Secondary | ICD-10-CM | POA: Diagnosis not present

## 2020-09-18 DIAGNOSIS — R0602 Shortness of breath: Secondary | ICD-10-CM

## 2020-09-18 DIAGNOSIS — J189 Pneumonia, unspecified organism: Secondary | ICD-10-CM | POA: Insufficient documentation

## 2020-09-18 DIAGNOSIS — U099 Post covid-19 condition, unspecified: Secondary | ICD-10-CM

## 2020-09-18 MED ORDER — MONTELUKAST SODIUM 10 MG PO TABS: 10.0000 mg | ORAL_TABLET | Freq: Every day | ORAL | 3 refills | Status: DC

## 2020-09-18 MED ORDER — ADVAIR HFA 230-21 MCG/ACT IN AERO: 2.0000 | INHALATION_SPRAY | Freq: Two times a day (BID) | RESPIRATORY_TRACT | 12 refills | Status: DC

## 2020-09-18 MED ORDER — PREDNISONE 50 MG PO TABS: ORAL_TABLET | ORAL | 0 refills | Status: DC

## 2020-09-18 MED ORDER — DOXYCYCLINE HYCLATE 100 MG PO TABS: 100.0000 mg | ORAL_TABLET | Freq: Two times a day (BID) | ORAL | 0 refills | Status: DC

## 2020-09-18 NOTE — Assessment & Plan Note (Signed)
Pneumonia based on clinical exam and symptoms.  Will treat with Doxycycline 100mg  BID x 10 days and prednisone 50mg  daily x 5 days.  CXR ordered and completed in clinic, awaiting over-read from radiology.  Encouraged follow up with pulmonology due to continued SOB s/p COVID diagnosis in 05/2020 and inability to return to work.  Likely would benefit from pulmonary rehab vs starting on daily medications for lung function.  Patient in agreement to contacting pulmonology to schedule appointment for re-evaluation.

## 2020-09-18 NOTE — Assessment & Plan Note (Signed)
Continued cough and SOB s/p COVID dx 05/2020.  Has been unable to return to work, will have him return to pulmonology for re-evaluation of lung function and if additional medications/testing/pulmonary rehabilitation would be beneficial.

## 2020-09-18 NOTE — Assessment & Plan Note (Signed)
Will have repeat labs drawn today

## 2020-09-18 NOTE — Patient Instructions (Signed)
We are treating you for pneumonia.  I have sent in a prescription for Prednisone 50mg  to take 1 tablet daily x 5 days.  I have also sent in a prescription for Doxycycline 100mg  to take 1 tablet 2x per day for the next 10 days.  Have your chest xray completed today and we will contact you when we receive the over-read from the radiology team  Have your labs drawn and we will contact you with the results.   Schedule a follow up visit with Pulmonology and we will see you back after that appointment.  We will plan to see you back in 4-6 weeks for shortness of breath follow up visit  You will receive a survey after today's visit either digitally by e-mail or paper by USPS mail. Your experiences and feedback matter to .  Please respond so we know how we are doing as we provide care for you.  Call with any questions/concerns/needs.  It is my goal to be available to you for your health concerns.  Thanks for choosing me to be a partner in your healthcare needs!  [place], FNP-C Family Nurse Practitioner [facility] Phone: [phone]

## 2020-09-18 NOTE — Progress Notes (Signed)
Subjective:    Patient ID: Alex Cruz, male    DOB: [birth year], 34 y.o.   MRN: [id number]  Alex Cruz is a 34 y.o. male presenting on 09/18/2020 for Post-COVID (chronic SOB, intermittent coughing. Symptoms worsening over the past 2 weeks. Productive cough w/ wheezing )   HPI  [name] presents to clinic for a follow up on his post-COVID cough and shortness of breath.  Reports over the past 2 weeks he has been having a productive cough with wheezing, needing a rescue inhaler with increased activity.  Denies any fevers, sore throat, change in taste/smell, CP, abdominal pain, n/v/d.  Has not been exposed to anyone that has been sick/tested positive for COVID.  Depression screen PHQ 2/9 07/04/2020  Decreased Interest 0  Down, Depressed, Hopeless 0  PHQ - 2 Score 0    Social History   Tobacco Use  . Smoking status: Never Smoker  . Smokeless tobacco: Never Used  Vaping Use  . Vaping Use: Never used  Substance Use Topics  . Alcohol use: Not Currently    Comment: social  . Drug use: No    Review of Systems  Constitutional: Negative.   HENT: Negative.   Eyes: Negative.   Respiratory: Positive for cough, shortness of breath and wheezing. Negative for apnea, choking, chest tightness and stridor.   Cardiovascular: Negative.   Gastrointestinal: Negative.   Endocrine: Negative.   Genitourinary: Negative.   Musculoskeletal: Negative.   Skin: Negative.   Allergic/Immunologic: Negative.   Neurological: Negative.   Hematological: Negative.   Psychiatric/Behavioral: Negative.    Per HPI unless specifically indicated above     Objective:    BP 119/69 (BP Location: Left Arm, Patient Position: Sitting, Cuff Size: Normal)   Pulse 88   Temp 98.1 F (36.7 C) (Oral)   Resp 17   Ht  (1.702 m)   Wt 184 lb 12.8 oz (83.8 kg)   SpO2 99%   BMI 28.94 kg/m   Wt Readings from Last 3 Encounters:  09/18/20 184 lb 12.8 oz (83.8 kg)  08/15/20 187 lb (84.8 kg)  07/25/20 184  lb 3.2 oz (83.6 kg)    Physical Exam Vitals and nursing note reviewed.  Constitutional:      General: He is not in acute distress.    Appearance: Normal appearance. He is well-developed, well-groomed and overweight. He is not ill-appearing or toxic-appearing.  HENT:     Head: Normocephalic and atraumatic.     Nose:     Comments: [name] is in place, covering mouth and nose. Eyes:     General:        Right eye: No discharge.        Left eye: No discharge.     Extraocular Movements: Extraocular movements intact.     Conjunctiva/sclera: Conjunctivae normal.     Pupils: Pupils are equal, round, and reactive to light.  Cardiovascular:     Rate and Rhythm: Normal rate and regular rhythm.     Pulses: Normal pulses.     Heart sounds: Normal heart sounds. No murmur heard.  No friction rub. No gallop.   Pulmonary:     Effort: Pulmonary effort is normal. No respiratory distress.     Breath sounds: Examination of the right-upper field reveals wheezing. Examination of the left-upper field reveals wheezing. Examination of the right-lower field reveals wheezing. Examination of the left-lower field reveals decreased breath sounds. Decreased breath sounds and wheezing present.  Musculoskeletal:  Right lower leg: No edema.     Left lower leg: No edema.  Skin:    General: Skin is warm and dry.     Capillary Refill: Capillary refill takes less than 2 seconds.  Neurological:     General: No focal deficit present.     Mental Status: He is alert and oriented to person, place, and time.  Psychiatric:        Attention and Perception: Attention and perception normal.        Mood and Affect: Mood and affect normal.        Speech: Speech normal.        Behavior: Behavior normal. Behavior is cooperative.        Thought Content: Thought content normal.        Cognition and Memory: Cognition and memory normal.    Results for orders placed or performed during the hospital encounter of 06/17/20   Culture, blood (Routine x 2)   Specimen: BLOOD  Result Value Ref Range   Specimen Description BLOOD LEFT FOREARM    Special Requests      BOTTLES DRAWN AEROBIC AND ANAEROBIC Blood Culture results may not be optimal due to an excessive volume of blood received in culture bottles   Culture      NO GROWTH 5 DAYS Performed at [facility], [address] [place], [place] [address]    Report Status 06/22/2020 FINAL   Culture, blood (Routine x 2)   Specimen: BLOOD  Result Value Ref Range   Specimen Description BLOOD LEFT FOREARM    Special Requests      BOTTLES DRAWN AEROBIC AND ANAEROBIC Blood Culture results may not be optimal due to an excessive volume of blood received in culture bottles   Culture      NO GROWTH 5 DAYS Performed at [facility], [address] [place], [place] [address]    Report Status 06/22/2020 FINAL   Comprehensive metabolic panel  Result Value Ref Range   Sodium 129 (L) 135 - 145 mmol/L   Potassium 3.7 3.5 - 5.1 mmol/L   Chloride 94 (L) 98 - 111 mmol/L   CO2 22 22 - 32 mmol/L   Glucose, Bld 105 (H) 70 - 99 mg/dL   BUN 15 6 - 20 mg/dL   Creatinine, Ser 0.980.90 0.61 - 1.24 mg/dL   Calcium 8.5 (L) 8.9 - 10.3 mg/dL   Total Protein 7.7 6.5 - 8.1 g/dL   Albumin 3.8 3.5 - 5.0 g/dL   AST 58 (H) 15 - 41 U/L   ALT 36 0 - 44 U/L   Alkaline Phosphatase 50 38 - 126 U/L   Total Bilirubin 0.9 0.3 - 1.2 mg/dL   GFR calc non Af Amer >60 >60 mL/min   GFR calc Af Amer >60 >60 mL/min   Anion gap 13 5 - 15  Lactic acid, plasma  Result Value Ref Range   Lactic Acid, Venous 0.9 0.5 - 1.9 mmol/L  Lactic acid, plasma  Result Value Ref Range   Lactic Acid, Venous 1.3 0.5 - 1.9 mmol/L  CBC with Differential  Result Value Ref Range   WBC 5.1 4.0 - 10.5 K/uL   RBC 5.78 4.22 - 5.81 MIL/uL   Hemoglobin 18.0 (H) 13.0 - 17.0 g/dL   HCT 11.950.2 39 - 52 %   MCV 86.9 80.0 - 100.0 fL   MCH 31.1 26.0 - 34.0 pg   MCHC 35.9 30.0 - 36.0 g/dL   RDW 12.0  11.5 - 15.5  %   Platelets 163 150 - 400 K/uL   nRBC 0.0 0.0 - 0.2 %   Neutrophils Relative % 82 %   Neutro Abs 4.1 1.7 - 7.7 K/uL   Lymphocytes Relative 14 %   Lymphs Abs 0.7 0.7 - 4.0 K/uL   Monocytes Relative 4 %   Monocytes Absolute 0.2 0.1 - 1.0 K/uL   Eosinophils Relative 0 %   Eosinophils Absolute 0.0 0.0 - 0.5 K/uL   Basophils Relative 0 %   Basophils Absolute 0.0 0.0 - 0.1 K/uL   Immature Granulocytes 0 %   Abs Immature Granulocytes 0.02 0.00 - 0.07 K/uL  Protime-INR  Result Value Ref Range   Prothrombin Time 12.4 11.4 - 15.2 seconds   INR 1.0 0.8 - 1.2  Procalcitonin - Baseline  Result Value Ref Range   Procalcitonin 0.34 ng/mL  Procalcitonin  Result Value Ref Range   Procalcitonin 0.16 ng/mL  HIV Antibody (routine testing w rflx)  Result Value Ref Range   HIV Screen 4th Generation wRfx Non Reactive Non Reactive  C-reactive protein  Result Value Ref Range   CRP <0.5 <1.0 mg/dL  Fibrin derivatives D-Dimer ([facility] only)  Result Value Ref Range   Fibrin derivatives D-dimer ([facility]) 1,097.24 (H) 0.00 - 499.00 ng/mL (FEU)  Procalcitonin  Result Value Ref Range   Procalcitonin 0.10 ng/mL  C-reactive protein  Result Value Ref Range   CRP 0.7 <1.0 mg/dL  Fibrin derivatives D-Dimer ([facility] only)  Result Value Ref Range   Fibrin derivatives D-dimer ([facility]) 729.22 (H) 0.00 - 499.00 ng/mL (FEU)  C-reactive protein  Result Value Ref Range   CRP 0.5 <1.0 mg/dL  Fibrin derivatives D-Dimer ([facility] only)  Result Value Ref Range   Fibrin derivatives D-dimer ([facility]) 507.60 (H) 0.00 - 499.00 ng/mL (FEU)  CBC  Result Value Ref Range   WBC 4.6 4.0 - 10.5 K/uL   RBC 4.92 4.22 - 5.81 MIL/uL   Hemoglobin 15.6 13.0 - 17.0 g/dL   HCT 12.7 39 - 52 %   MCV 89.8 80.0 - 100.0 fL   MCH 31.7 26.0 - 34.0 pg   MCHC 35.3 30.0 - 36.0 g/dL   RDW 51.7 00.1 - 74.9 %   Platelets 195 150 - 400 K/uL   nRBC 0.0 0.0 - 0.2 %  Comprehensive metabolic panel  Result Value Ref Range   Sodium 138 135 - 145 mmol/L    Potassium 4.3 3.5 - 5.1 mmol/L   Chloride 103 98 - 111 mmol/L   CO2 27 22 - 32 mmol/L   Glucose, Bld 149 (H) 70 - 99 mg/dL   BUN 16 6 - 20 mg/dL   Creatinine, Ser 4.49 0.61 - 1.24 mg/dL   Calcium 8.2 (L) 8.9 - 10.3 mg/dL   Total Protein 6.5 6.5 - 8.1 g/dL   Albumin 3.1 (L) 3.5 - 5.0 g/dL   AST 62 (H) 15 - 41 U/L   ALT 73 (H) 0 - 44 U/L   Alkaline Phosphatase 50 38 - 126 U/L   Total Bilirubin 0.8 0.3 - 1.2 mg/dL   GFR calc non Af Amer >60 >60 mL/min   GFR calc Af Amer >60 >60 mL/min   Anion gap 8 5 - 15  C-reactive protein  Result Value Ref Range   CRP <0.5 <1.0 mg/dL  Fibrin derivatives D-Dimer ([facility] only)  Result Value Ref Range   Fibrin derivatives D-dimer ([facility]) 315.64 0.00 - 499.00 ng/mL (FEU)  CBC  Result Value Ref  Range   WBC 7.6 4.0 - 10.5 K/uL   RBC 5.10 4.22 - 5.81 MIL/uL   Hemoglobin 16.3 13.0 - 17.0 g/dL   HCT 35.3 39 - 52 %   MCV 86.7 80.0 - 100.0 fL   MCH 32.0 26.0 - 34.0 pg   MCHC 36.9 (H) 30.0 - 36.0 g/dL   RDW 29.9 24.2 - 68.3 %   Platelets 253 150 - 400 K/uL   nRBC 0.0 0.0 - 0.2 %  Comprehensive metabolic panel  Result Value Ref Range   Sodium 136 135 - 145 mmol/L   Potassium 4.7 3.5 - 5.1 mmol/L   Chloride 106 98 - 111 mmol/L   CO2 25 22 - 32 mmol/L   Glucose, Bld 140 (H) 70 - 99 mg/dL   BUN 17 6 - 20 mg/dL   Creatinine, Ser 4.19 0.61 - 1.24 mg/dL   Calcium 8.5 (L) 8.9 - 10.3 mg/dL   Total Protein 6.4 (L) 6.5 - 8.1 g/dL   Albumin 3.1 (L) 3.5 - 5.0 g/dL   AST 622 (H) 15 - 41 U/L   ALT 241 (H) 0 - 44 U/L   Alkaline Phosphatase 46 38 - 126 U/L   Total Bilirubin 1.0 0.3 - 1.2 mg/dL   GFR calc non Af Amer >60 >60 mL/min   GFR calc Af Amer >60 >60 mL/min   Anion gap 5 5 - 15  ABO/Rh  Result Value Ref Range   ABO/RH(D)      O POS Performed at [facility], [address] [place], [place] [address]   Troponin I (High Sensitivity)  Result Value Ref Range   Troponin I (High Sensitivity) 7 <18 ng/L  Troponin I (High Sensitivity)  Result  Value Ref Range   Troponin I (High Sensitivity) 4 <18 ng/L      Assessment & Plan:   Problem List Items Addressed This Visit      Respiratory   Pneumonia due to infectious organism - Primary    Pneumonia based on clinical exam and symptoms.  Will treat with Doxycycline 100mg  BID x 10 days and prednisone 50mg  daily x 5 days.  CXR ordered and completed in clinic, awaiting over-read from radiology.  Encouraged follow up with pulmonology due to continued SOB s/p COVID diagnosis in 05/2020 and inability to return to work.  Likely would benefit from pulmonary rehab vs starting on daily medications for lung function.  Patient in agreement to contacting pulmonology to schedule appointment for re-evaluation.      Relevant Medications   doxycycline (VIBRA-TABS) 100 MG tablet   predniSONE (DELTASONE) 50 MG tablet   fluticasone-salmeterol (ADVAIR HFA) 230-21 MCG/ACT inhaler   montelukast (SINGULAIR) 10 MG tablet     Other   Shortness of breath   Relevant Medications   fluticasone-salmeterol (ADVAIR HFA) 230-21 MCG/ACT inhaler   montelukast (SINGULAIR) 10 MG tablet   Post-COVID syndrome   Relevant Medications   fluticasone-salmeterol (ADVAIR HFA) 230-21 MCG/ACT inhaler   montelukast (SINGULAIR) 10 MG tablet   Cough    Continued cough and SOB s/p COVID dx 05/2020.  Has been unable to return to work, will have him return to pulmonology for re-evaluation of lung function and if additional medications/testing/pulmonary rehabilitation would be beneficial.      Relevant Medications   fluticasone-salmeterol (ADVAIR HFA) 230-21 MCG/ACT inhaler   Other Relevant Orders   DG Chest 2 View (Completed)   Abnormal laboratory test    Will have repeat labs drawn today  Relevant Orders   CBC with Differential   TSH + free T4   Elevated liver enzymes    Elevated LFT when at [facility] with COVID diagnosis, will have repeat labs drawn today.      Relevant Orders   COMPLETE METABOLIC PANEL WITH GFR       Meds ordered this encounter  Medications  . doxycycline (VIBRA-TABS) 100 MG tablet    Sig: Take 1 tablet (100 mg total) by mouth 2 (two) times daily.    Dispense:  20 tablet    Refill:  0  . predniSONE (DELTASONE) 50 MG tablet    Sig: Take 1 tablet daily x 5 days    Dispense:  5 tablet    Refill:  0  . fluticasone-salmeterol (ADVAIR HFA) 230-21 MCG/ACT inhaler    Sig: Inhale 2 puffs into the lungs 2 (two) times daily.    Dispense:  1 each    Refill:  12  . montelukast (SINGULAIR) 10 MG tablet    Sig: Take 1 tablet (10 mg total) by mouth at bedtime.    Dispense:  90 tablet    Refill:  3    Follow up plan: Return in about 6 weeks (around 10/30/2020) for Cough/SOB F/U Visit.   Charlaine Dalton, FNP Family Nurse Practitioner [facility] 09/18/2020, 11:23 AM

## 2020-09-18 NOTE — Assessment & Plan Note (Signed)
Elevated LFT when at [facility] with COVID diagnosis, will have repeat labs drawn today.

## 2020-09-19 LAB — COMPLETE METABOLIC PANEL WITH GFR
AG Ratio: 1.7 (calc) (ref 1.0–2.5)
ALT: 17 U/L (ref 9–46)
AST: 14 U/L (ref 10–40)
Albumin: 4.3 g/dL (ref 3.6–5.1)
Alkaline phosphatase (APISO): 58 U/L (ref 36–130)
BUN: 9 mg/dL (ref 7–25)
CO2: 29 mmol/L (ref 20–32)
Calcium: 9.5 mg/dL (ref 8.6–10.3)
Chloride: 103 mmol/L (ref 98–110)
Creat: 0.82 mg/dL (ref 0.60–1.35)
GFR, Est African American: 135 mL/min/{1.73_m2} (ref >=60)
GFR, Est Non African American: 116 mL/min/{1.73_m2} (ref >=60)
Globulin: 2.6 g/dL (calc) (ref 1.9–3.7)
Glucose, Bld: 85 mg/dL (ref 65–99)
Potassium: 3.8 mmol/L (ref 3.5–5.3)
Sodium: 140 mmol/L (ref 135–146)
Total Bilirubin: 0.6 mg/dL (ref 0.2–1.2)
Total Protein: 6.9 g/dL (ref 6.1–8.1)

## 2020-09-19 LAB — CBC WITH DIFFERENTIAL/PLATELET
Absolute Monocytes: 538 cells/uL (ref 200–950)
Basophils Absolute: 58 cells/uL (ref 0–200)
Basophils Relative: 0.9 %
Eosinophils Absolute: 109 cells/uL (ref 15–500)
Eosinophils Relative: 1.7 %
HCT: 47.7 % (ref 38.5–50.0)
Hemoglobin: 16.8 g/dL (ref 13.2–17.1)
Lymphs Abs: 2067 cells/uL (ref 850–3900)
MCH: 31.5 pg (ref 27.0–33.0)
MCHC: 35.2 g/dL (ref 32.0–36.0)
MCV: 89.5 fL (ref 80.0–100.0)
MPV: 10.5 fL (ref 7.5–12.5)
Monocytes Relative: 8.4 %
Neutro Abs: 3629 cells/uL (ref 1500–7800)
Neutrophils Relative %: 56.7 %
Platelets: 272 10*3/uL (ref 140–400)
RBC: 5.33 10*6/uL (ref 4.20–5.80)
RDW: 11.9 % (ref 11.0–15.0)
Total Lymphocyte: 32.3 %
WBC: 6.4 10*3/uL (ref 3.8–10.8)

## 2020-09-19 LAB — TSH+FREE T4: TSH W/REFLEX TO FT4: 0.94 mIU/L (ref 0.40–4.50)

## 2020-10-30 ENCOUNTER — Encounter: Payer: Self-pay | Admitting: Family Medicine

## 2020-10-30 ENCOUNTER — Ambulatory Visit (INDEPENDENT_AMBULATORY_CARE_PROVIDER_SITE_OTHER): Payer: Self-pay | Admitting: Family Medicine

## 2020-10-30 ENCOUNTER — Other Ambulatory Visit: Payer: Self-pay

## 2020-10-30 VITALS — BP 131/69 | HR 91 | Temp 98.1°F | Resp 18 | Ht 67.0 in | Wt 180.6 lb

## 2020-10-30 DIAGNOSIS — U099 Post covid-19 condition, unspecified: Secondary | ICD-10-CM

## 2020-10-30 NOTE — Assessment & Plan Note (Signed)
Has made moderate improvement with his symptom recovery from COVID.  Reports was able to run at the gym over the last 2 weeks and is requesting to discuss with Dr. Belia Heman on his next visit on 11/08/2020 his return to work.  Discussed will be transferring his RTW and FMLA paperwork to Dr. Belia Heman for further completion.  Patient in agreement with plan.

## 2020-10-30 NOTE — Progress Notes (Signed)
Subjective:    Patient ID: Alex Cruz, male    DOB: 1986-08-13, 35 y.o.   MRN: [id number]  Alex Cruz is a 35 y.o. male presenting on 10/30/2020 for Post COVID x 5 mths ago  (F/u on short term disability. Pt reports moderate improvement since last visit.)   HPI  Mr. Monette presents to clinic for concerns for post-COVID syndrome follow up.  Reports he has made moderate improvement in his fatigue and shortness of breath since his last visit 6 months ago.  He has been able to participate with running at the gym over the past 2 weeks.  Has an upcoming appointment with Dr. Belia Heman from pulmonology on 09/08/2021.  Depression screen PHQ 2/9 07/04/2020  Decreased Interest 0  Down, Depressed, Hopeless 0  PHQ - 2 Score 0    Social History   Tobacco Use  . Smoking status: Never Smoker  . Smokeless tobacco: Never Used  Vaping Use  . Vaping Use: Never used  Substance Use Topics  . Alcohol use: Not Currently    Comment: social  . Drug use: No    Review of Systems  Constitutional: Negative.   HENT: Negative.   Eyes: Negative.   Respiratory: Negative for apnea, cough, choking, chest tightness, shortness of breath, wheezing and stridor.   Cardiovascular: Negative.   Gastrointestinal: Negative.   Endocrine: Negative.   Genitourinary: Negative.   Musculoskeletal: Negative.   Skin: Negative.   Allergic/Immunologic: Negative.   Neurological: Negative.   Hematological: Negative.   Psychiatric/Behavioral: Negative.    Per HPI unless specifically indicated above     Objective:    BP 131/69 (BP Location: Right Arm, Patient Position: Sitting, Cuff Size: Normal)   Pulse 91   Temp 98.1 F (36.7 C) (Oral)   Resp 18   Ht 5\' 7"  (1.702 m)   Wt 180 lb 9.6 oz (81.9 kg)   SpO2 99%   BMI 28.29 kg/m   Wt Readings from Last 3 Encounters:  10/30/20 180 lb 9.6 oz (81.9 kg)  09/18/20 184 lb 12.8 oz (83.8 kg)  08/15/20 187 lb (84.8 kg)    Physical Exam Vitals and nursing note  reviewed.  Constitutional:      General: He is not in acute distress.    Appearance: Normal appearance. He is well-developed and well-groomed. He is not ill-appearing or toxic-appearing.  HENT:     Head: Normocephalic and atraumatic.     Nose:     Comments: 08/17/20 is in place, covering mouth and nose. Eyes:     General:        Right eye: No discharge.        Left eye: No discharge.     Extraocular Movements: Extraocular movements intact.     Conjunctiva/sclera: Conjunctivae normal.     Pupils: Pupils are equal, round, and reactive to light.  Cardiovascular:     Pulses: Normal pulses.  Pulmonary:     Effort: Pulmonary effort is normal. No respiratory distress.  Skin:    General: Skin is warm and dry.     Capillary Refill: Capillary refill takes less than 2 seconds.  Neurological:     General: No focal deficit present.     Mental Status: He is alert and oriented to person, place, and time.  Psychiatric:        Attention and Perception: Attention and perception normal.        Mood and Affect: Mood and affect normal.  Speech: Speech normal.        Behavior: Behavior normal. Behavior is cooperative.        Thought Content: Thought content normal.        Cognition and Memory: Cognition and memory normal.    Results for orders placed or performed in visit on 09/18/20  CBC with Differential  Result Value Ref Range   WBC 6.4 3.8 - 10.8 Thousand/uL   RBC 5.33 4.20 - 5.80 Million/uL   Hemoglobin 16.8 13.2 - 17.1 g/dL   HCT 81.8 56.3 - 14.9 %   MCV 89.5 80.0 - 100.0 fL   MCH 31.5 27.0 - 33.0 pg   MCHC 35.2 32.0 - 36.0 g/dL   RDW 70.2 63.7 - 85.8 %   Platelets 272 140 - 400 Thousand/uL   MPV 10.5 7.5 - 12.5 fL   Neutro Abs 3,629 1,500 - 7,800 cells/uL   Lymphs Abs 2,067 850 - 3,900 cells/uL   Absolute Monocytes 538 200 - 950 cells/uL   Eosinophils Absolute 109 15 - 500 cells/uL   Basophils Absolute 58 0 - 200 cells/uL   Neutrophils Relative % 56.7 %   Total Lymphocyte  32.3 %   Monocytes Relative 8.4 %   Eosinophils Relative 1.7 %   Basophils Relative 0.9 %  COMPLETE METABOLIC PANEL WITH GFR  Result Value Ref Range   Glucose, Bld 85 65 - 99 mg/dL   BUN 9 7 - 25 mg/dL   Creat 8.50 2.77 - 4.12 mg/dL   GFR, Est Non African American 116 > OR = 60 mL/min/1.78m2   GFR, Est African American 135 > OR = 60 mL/min/1.80m2   BUN/Creatinine Ratio NOT APPLICABLE 6 - 22 (calc)   Sodium 140 135 - 146 mmol/L   Potassium 3.8 3.5 - 5.3 mmol/L   Chloride 103 98 - 110 mmol/L   CO2 29 20 - 32 mmol/L   Calcium 9.5 8.6 - 10.3 mg/dL   Total Protein 6.9 6.1 - 8.1 g/dL   Albumin 4.3 3.6 - 5.1 g/dL   Globulin 2.6 1.9 - 3.7 g/dL (calc)   AG Ratio 1.7 1.0 - 2.5 (calc)   Total Bilirubin 0.6 0.2 - 1.2 mg/dL   Alkaline phosphatase (APISO) 58 36 - 130 U/L   AST 14 10 - 40 U/L   ALT 17 9 - 46 U/L  TSH + free T4  Result Value Ref Range   TSH W/REFLEX TO FT4 0.94 0.40 - 4.50 mIU/L      Assessment & Plan:   Problem List Items Addressed This Visit      Other   Post-COVID syndrome - Primary    Has made moderate improvement with his symptom recovery from COVID.  Reports was able to run at the gym over the last 2 weeks and is requesting to discuss with Dr. Belia Heman on his next visit on 11/08/2020 his return to work.  Discussed will be transferring his RTW and FMLA paperwork to Dr. Belia Heman for further completion.  Patient in agreement with plan.         No orders of the defined types were placed in this encounter.  Follow up plan: Return if symptoms worsen or fail to improve.   Charlaine Dalton, FNP Family Nurse Practitioner [facility] 10/30/2020, 9:04 AM

## 2020-10-30 NOTE — Patient Instructions (Signed)
Keep your upcoming appointment with Dr. Belia Heman from Pulmonology on 11/08/2020.  We will defer to Dr. Clovis Fredrickson opinion for your return to work plan.  We will plan to see you back if your symptoms worsen or fail to improve  You will receive a survey after today's visit either digitally by e-mail or paper by USPS mail. Your experiences and feedback matter to [place].  Please respond so we know how we are doing as we provide care for you.  Call us with any questions/concerns/needs.  It is my goal to be available to you for your health concerns.  Thanks for choosing me to be a partner in your healthcare needs!  Charlaine Dalton, FNP-C Family Nurse Practitioner [facility] Phone: [phone]

## 2020-11-08 ENCOUNTER — Other Ambulatory Visit: Payer: Self-pay

## 2020-11-08 ENCOUNTER — Encounter: Payer: Self-pay | Admitting: Internal Medicine

## 2020-11-08 ENCOUNTER — Ambulatory Visit (INDEPENDENT_AMBULATORY_CARE_PROVIDER_SITE_OTHER): Payer: Self-pay | Admitting: Internal Medicine

## 2020-11-08 VITALS — BP 126/60 | HR 80 | Temp 97.8°F | Ht 67.0 in | Wt 176.4 lb

## 2020-11-08 DIAGNOSIS — U071 COVID-19: Secondary | ICD-10-CM

## 2020-11-08 DIAGNOSIS — J4520 Mild intermittent asthma, uncomplicated: Secondary | ICD-10-CM

## 2020-11-08 NOTE — Progress Notes (Signed)
Name: Alex Cruz MRN: [id number] DOB: 04-16-1986     CONSULTATION DATE: 11/08/2020  REFERRING MD : Ethelene Hal  CHIEF COMPLAINT: COVID 19 infection   STUDIES:     05/2020 CXR independently reviewed by Me diffuse b/l interstitial infiltrates    SYNOPSIS 35 yo male with recent admission for COVID 19 pneumonia 06/17/2020 Diagnosed with  Acute hypoxic respiratory failure secondary to COVID-19 pneumonia.   The patient qualified for home oxygen.  Completed 5 days of remdesivir/steroids Discharged with vitamin C and zinc and an albuterol inhaler.   Patient also had diagnosis of Hyponatremia with elevated liver function tests Chest x-ray  bilateral interstitial infiltrates signifying significant lung disease and damage Patient has a previous history and diagnosis of asthma Patient last asthma attack that required hospitalization was at the age of 8 Patient used intermittent inhalers as a child   CC  Follow up COVID  HPI Previous DX of COVID infection 05/2020  No exacerbation at this time No evidence of heart failure at this time No evidence or signs of infection at this time No respiratory distress No fevers, chills, nausea, vomiting, diarrhea No evidence of lower extremity edema No evidence hemoptysis Patient runs 4 miles per day Very active Patient's oxygen was discontinued a month ago 6-minute walk test within normal limits  Uses albuterol as needed Advair as needed but no longer needs it  Patient may return to work without any restrictions     PAST MEDICAL HISTORY :   has a past medical history of COVID-19 (06/10/2020) and No known health problems.  has no past surgical history on file. Prior to Admission medications   Medication Sig Start Date End Date Taking? Authorizing Provider  albuterol (VENTOLIN HFA) 108 (90 Base) MCG/ACT inhaler Inhale 2 puffs into the lungs every 6 (six) hours as needed for wheezing or shortness of breath. 07/18/20   Malfi, Jodelle Gross,  FNP  vitamin C (ASCORBIC ACID) 250 MG tablet Take 1 tablet (250 mg total) by mouth 2 (two) times daily. 06/21/20   Alford Highland, MD  Zinc 220 (50 Zn) MG CAPS Take 220 mg by mouth daily. 06/21/20   Alford Highland, MD   No Known Allergies  FAMILY HISTORY:  family history includes Healthy in his mother; Heart attack in his father; Hypertension in his father. SOCIAL HISTORY:  reports that he has never smoked. He has never used smokeless tobacco. He reports previous alcohol use. He reports that he does not use drugs.    Review of Systems:  Gen:  Denies  fever, sweats, chills weight loss  HEENT: Denies blurred vision, double vision, ear pain, eye pain, hearing loss, nose bleeds, sore throat Cardiac:  No dizziness, chest pain or heaviness, chest tightness,edema, No JVD Resp:   No cough, -sputum production, -shortness of breath,-wheezing, -hemoptysis,  Gi: Denies swallowing difficulty, stomach pain, nausea or vomiting, diarrhea, constipation, bowel incontinence Gu:  Denies bladder incontinence, burning urine Ext:   Denies Joint pain, stiffness or swelling Skin: Denies  skin rash, easy bruising or bleeding or hives Endoc:  Denies polyuria, polydipsia , polyphagia or weight change Psych:   Denies depression, insomnia or hallucinations  Other:  All other systems negative  BP 126/60 (BP Location: Left Arm, Cuff Size: Normal)   Pulse 80   SpO2 99%    Physical Examination:   General Appearance: No distress  Neuro:without focal findings,  speech normal,  HEENT: PERRLA, EOM intact.   Pulmonary: normal breath sounds, No wheezing.  CardiovascularNormal S1,S2.  No m/r/g.   Abdomen: Benign, Soft, non-tender. Renal:  No costovertebral tenderness  GU:  Not performed at this time. Endoc: No evident thyromegaly Skin:   warm, no rashes, no ecchymosis  Extremities: normal, no cyanosis, clubbing. PSYCHIATRIC: Mood, affect within normal limits.   ALL OTHER ROS ARE NEGATIVE   MEDICATIONS: I  have reviewed all medications and confirmed regimen as documented       ASSESSMENT AND PLAN  35 year old white male with previous infection with COVID-19 pneumonia in August 2021 with bilateral interstitial infiltrates discharged on home oxygen therapy evaluated for post COVID-pneumonia pneumonitis with a history of asthma  Mild intermittent asthma  S/P COVID-19 infection pneumonitis Recommend stopping Advair Use ALBUTEROL AS NEEDED Avoid allergens   Hypoxic respiratory failure-resolved  Pulmonary hygiene Continue exercise as tolerated     COVID-19 EDUCATION: The signs and symptoms of COVID-19 were discussed with the patient and how to seek care for testing.  The importance of social distancing was discussed today. Hand Washing Techniques and avoid touching face was advised.     MEDICATION ADJUSTMENTS/LABS AND TESTS ORDERED: Albuterol as needed Recommend COVID-19 vaccination   CURRENT MEDICATIONS REVIEWED AT LENGTH WITH PATIENT TODAY   Patient satisfied with Plan of action and management. All questions answered  Follow up in 1 year  Total time spent 31 minutes  Lucie Leather, M.D.  [facility]  Medical Director [facility] Medical Director [facility] Cardio-Pulmonary Department

## 2020-11-08 NOTE — Patient Instructions (Signed)
No need for ADVAIR ALBUTEROL AS NEEDED  RECOMMEND COVID VACCINE  PATIENT TO RETURN TO WORK-NO RESTRICTIONS

## 2021-02-27 ENCOUNTER — Ambulatory Visit: Payer: Self-pay

## 2021-02-27 DIAGNOSIS — M67842 Other specified disorders of synovium, left hand: Secondary | ICD-10-CM | POA: Diagnosis not present

## 2021-02-27 DIAGNOSIS — R200 Anesthesia of skin: Secondary | ICD-10-CM | POA: Diagnosis not present

## 2021-02-27 NOTE — Telephone Encounter (Signed)
Pt. Reports he was handcuffed by law enforcement 2 days ago. Since then has had constant tingling to left hand. Has redness and an abrasion to wrist. Appointment made for 03/01/21. Pt. Would like to be seen sooner if possible. Spoke with Fleet Contras in the practice and will send triage for review.  Reason for Disposition . [1] Tingling (e.g., pins and needles) of the face, arm / hand, or leg / foot on one side of the body AND [2] present now (Exceptions: chronic/recurrent symptom lasting > 4 weeks or tingling from known cause, such as: bumped elbow, carpal tunnel syndrome, pinched nerve, frostbite)  Answer Assessment - Initial Assessment Questions 1. SYMPTOM: "What is the main symptom you are concerned about?" (e.g., weakness, numbness)     Left hand tingling 2. ONSET: "When did this start?" (minutes, hours, days; while sleeping)     2 days ago 3. LAST NORMAL: "When was the last time you (the patient) were normal (no symptoms)?"     2 days ago 4. PATTERN "Does this come and go, or has it been constant since it started?"  "Is it present now?"     Constant 5. CARDIAC SYMPTOMS: "Have you had any of the following symptoms: chest pain, difficulty breathing, palpitations?"     No 6. NEUROLOGIC SYMPTOMS: "Have you had any of the following symptoms: headache, dizziness, vision loss, double vision, changes in speech, unsteady on your feet?"     No 7. OTHER SYMPTOMS: "Do you have any other symptoms?"     No 8. PREGNANCY: "Is there any chance you are pregnant?" "When was your last menstrual period?"     n/a  Protocols used: NEUROLOGIC DEFICIT-A-AH

## 2021-03-01 ENCOUNTER — Ambulatory Visit: Payer: Self-pay | Admitting: Internal Medicine

## 2021-03-01 NOTE — Progress Notes (Deleted)
Subjective:    Patient ID: Alex Cruz, male    DOB: November 13, 1985, 35 y.o.   MRN: [id number]  HPI  Pt presents to the clinic today with c/o tingling of his left hand. He saw [facility] for the same 5/10, started on a Medrol dose pack for tendonitis and paresthesia.  Review of Systems  Past Medical History:  Diagnosis Date  . COVID-19 06/10/2020  . No known health problems     Current Outpatient Medications  Medication Sig Dispense Refill  . albuterol (VENTOLIN HFA) 108 (90 Base) MCG/ACT inhaler Inhale 2 puffs into the lungs every 6 (six) hours as needed for wheezing or shortness of breath. 8 g 2  . montelukast (SINGULAIR) 10 MG tablet Take 1 tablet (10 mg total) by mouth at bedtime. 90 tablet 3  . vitamin C (ASCORBIC ACID) 250 MG tablet Take 1 tablet (250 mg total) by mouth 2 (two) times daily. 60 tablet 0  . Zinc 220 (50 Zn) MG CAPS Take 220 mg by mouth daily. 30 capsule 0   No current facility-administered medications for this visit.    No Known Allergies  Family History  Problem Relation Age of Onset  . Healthy Mother   . Hypertension Father   . Heart attack Father     Social History   Socioeconomic History  . Marital status: Single    Spouse name: Not on file  . Number of children: Not on file  . Years of education: Not on file  . Highest education level: Not on file  Occupational History  . Not on file  Tobacco Use  . Smoking status: Never Smoker  . Smokeless tobacco: Never Used  Vaping Use  . Vaping Use: Never used  Substance and Sexual Activity  . Alcohol use: Not Currently    Comment: social  . Drug use: No  . Sexual activity: Not on file  Other Topics Concern  . Not on file  Social History Narrative  . Not on file   Social Determinants of Health   Financial Resource Strain: Not on file  Food Insecurity: Not on file  Transportation Needs: Not on file  Physical Activity: Not on file  Stress: Not on file  Social Connections: Not on file   Intimate Partner Violence: Not on file     Constitutional: Denies fever, malaise, fatigue, headache or abrupt weight changes.  HEENT: Denies eye pain, eye redness, ear pain, ringing in the ears, wax buildup, runny nose, nasal congestion, bloody nose, or sore throat. Respiratory: Denies difficulty breathing, shortness of breath, cough or sputum production.   Cardiovascular: Denies chest pain, chest tightness, palpitations or swelling in the hands or feet.  Gastrointestinal: Denies abdominal pain, bloating, constipation, diarrhea or blood in the stool.  GU: Denies urgency, frequency, pain with urination, burning sensation, blood in urine, odor or discharge. Musculoskeletal: Denies decrease in range of motion, difficulty with gait, muscle pain or joint pain and swelling.  Skin: Denies redness, rashes, lesions or ulcercations.  Neurological: Pt reports tingling of his left hand. Denies dizziness, difficulty with memory, difficulty with speech or problems with balance and coordination.  Psych: Denies anxiety, depression, SI/HI.  No other specific complaints in a complete review of systems (except as listed in HPI above).     Objective:   Physical Exam  There were no vitals taken for this visit. Wt Readings from Last 3 Encounters:  11/08/20 176 lb 6.4 oz (80 kg)  10/30/20 180 lb 9.6 oz (81.9  kg)  09/18/20 184 lb 12.8 oz (83.8 kg)    General: Appears their stated age, well developed, well nourished in NAD. Skin: Warm, dry and intact. No rashes, lesions or ulcerations noted. HEENT: Head: normal shape and size; Eyes: sclera white, no icterus, conjunctiva pink, PERRLA and EOMs intact; Ears: Tm's gray and intact, normal light reflex; Nose: mucosa pink and moist, septum midline; Throat/Mouth: Teeth present, mucosa pink and moist, no exudate, lesions or ulcerations noted.  Neck:  Neck supple, trachea midline. No masses, lumps or thyromegaly present.  Cardiovascular: Normal rate and rhythm. S1,S2  noted.  No murmur, rubs or gallops noted. No JVD or BLE edema. No carotid bruits noted. Pulmonary/Chest: Normal effort and positive vesicular breath sounds. No respiratory distress. No wheezes, rales or ronchi noted.  Abdomen: Soft and nontender. Normal bowel sounds. No distention or masses noted. Liver, spleen and kidneys non palpable. Musculoskeletal: Normal range of motion. No signs of joint swelling. No difficulty with gait.  Neurological: Alert and oriented. Cranial nerves II-XII grossly intact. Coordination normal.  Psychiatric: Mood and affect normal. Behavior is normal. Judgment and thought content normal.   EKG:  BMET    Component Value Date/Time   NA 140 09/18/2020 0920   K 3.8 09/18/2020 0920   CL 103 09/18/2020 0920   CO2 29 09/18/2020 0920   GLUCOSE 85 09/18/2020 0920   BUN 9 09/18/2020 0920   CREATININE 0.82 09/18/2020 0920   CALCIUM 9.5 09/18/2020 0920   GFRNONAA 116 09/18/2020 0920   GFRAA 135 09/18/2020 0920    Lipid Panel  No results found for: CHOL, TRIG, HDL, CHOLHDL, VLDL, LDLCALC  CBC    Component Value Date/Time   WBC 6.4 09/18/2020 0920   RBC 5.33 09/18/2020 0920   HGB 16.8 09/18/2020 0920   HCT 47.7 09/18/2020 0920   PLT 272 09/18/2020 0920   MCV 89.5 09/18/2020 0920   MCH 31.5 09/18/2020 0920   MCHC 35.2 09/18/2020 0920   RDW 11.9 09/18/2020 0920   LYMPHSABS 2,067 09/18/2020 0920   MONOABS 0.2 06/17/2020 1627   EOSABS 109 09/18/2020 0920   BASOSABS 58 09/18/2020 0920    Hgb A1C No results found for: HGBA1C         Assessment & Plan:    Nicki Reaper, NP This visit occurred during the SARS-CoV-2 public health emergency.  Safety protocols were in place, including screening questions prior to the visit, additional usage of staff PPE, and extensive cleaning of exam room while observing appropriate contact time as indicated for disinfecting solutions.

## 2021-03-02 DIAGNOSIS — L905 Scar conditions and fibrosis of skin: Secondary | ICD-10-CM | POA: Diagnosis not present

## 2021-03-02 DIAGNOSIS — L700 Acne vulgaris: Secondary | ICD-10-CM | POA: Diagnosis not present

## 2021-11-09 IMAGING — CR DG CHEST 2V
2 series · 2 of 2 positions shown · non-contrast
Comparison: November 14, 2018

CLINICAL DATA: Cough and fever

EXAM:
CHEST - 2 VIEW

[chest pa]
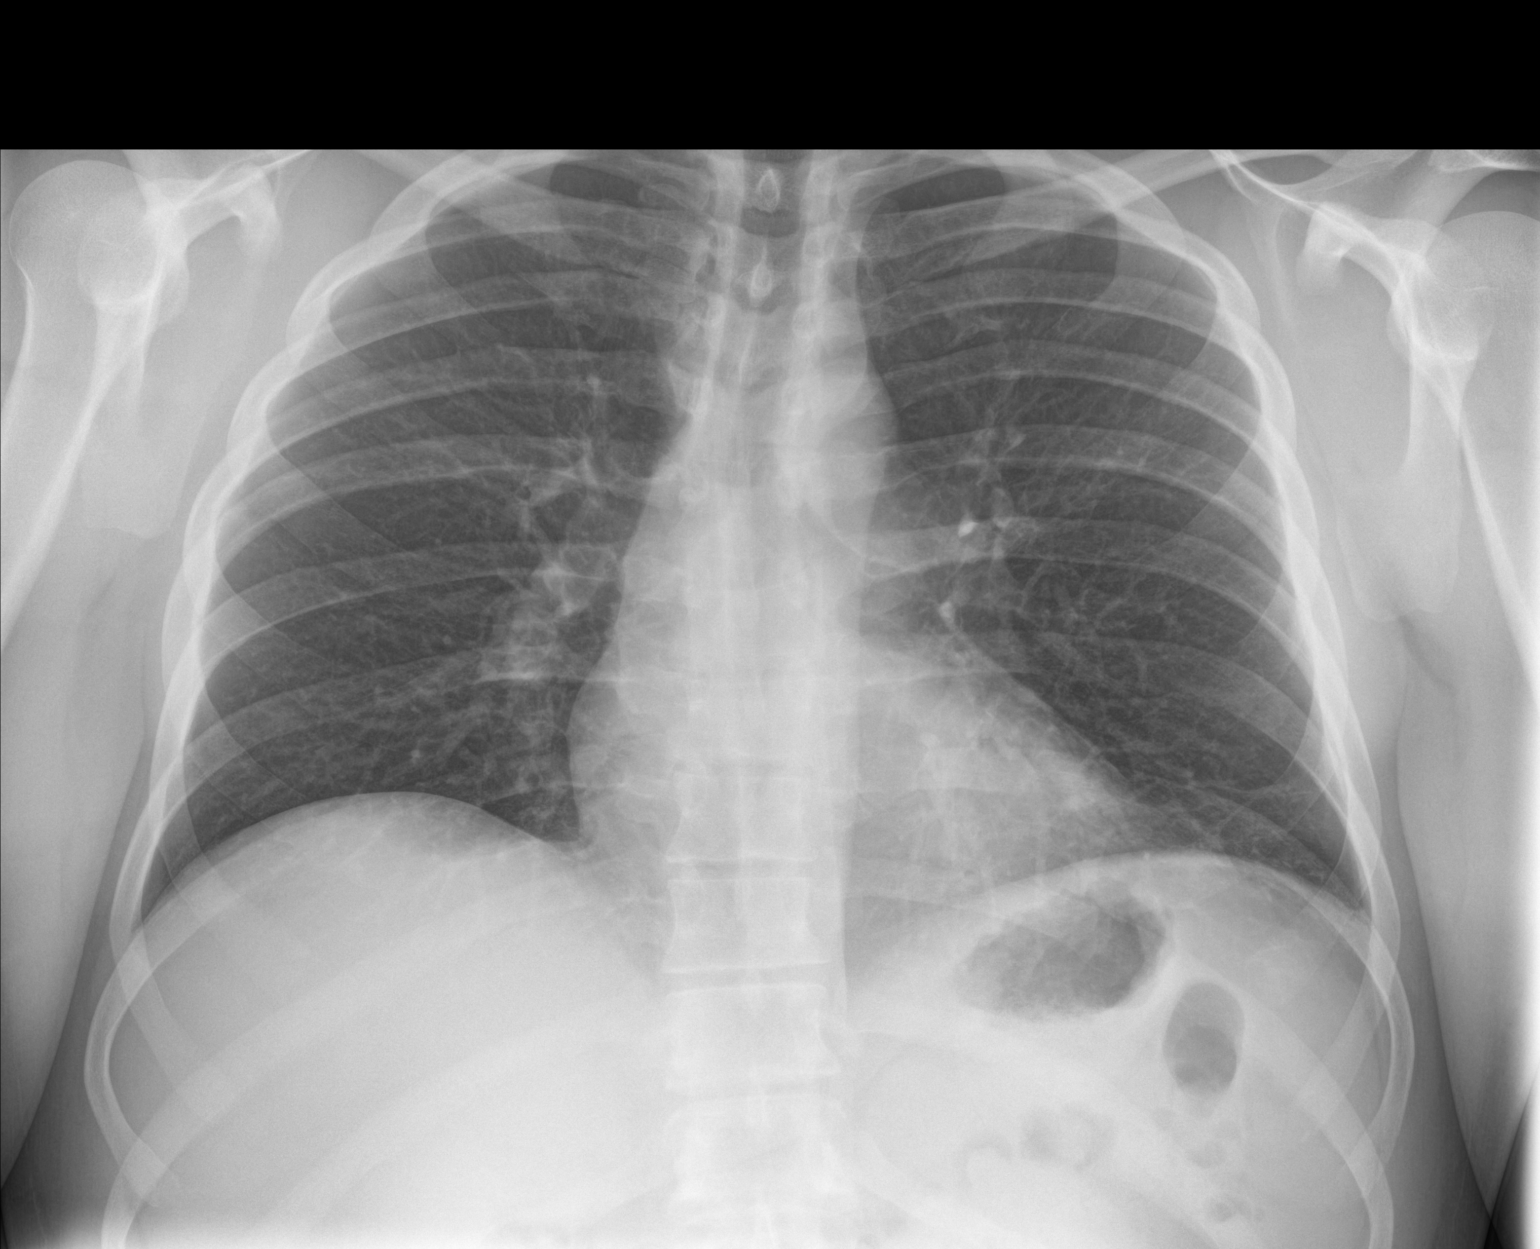

[chest lat]
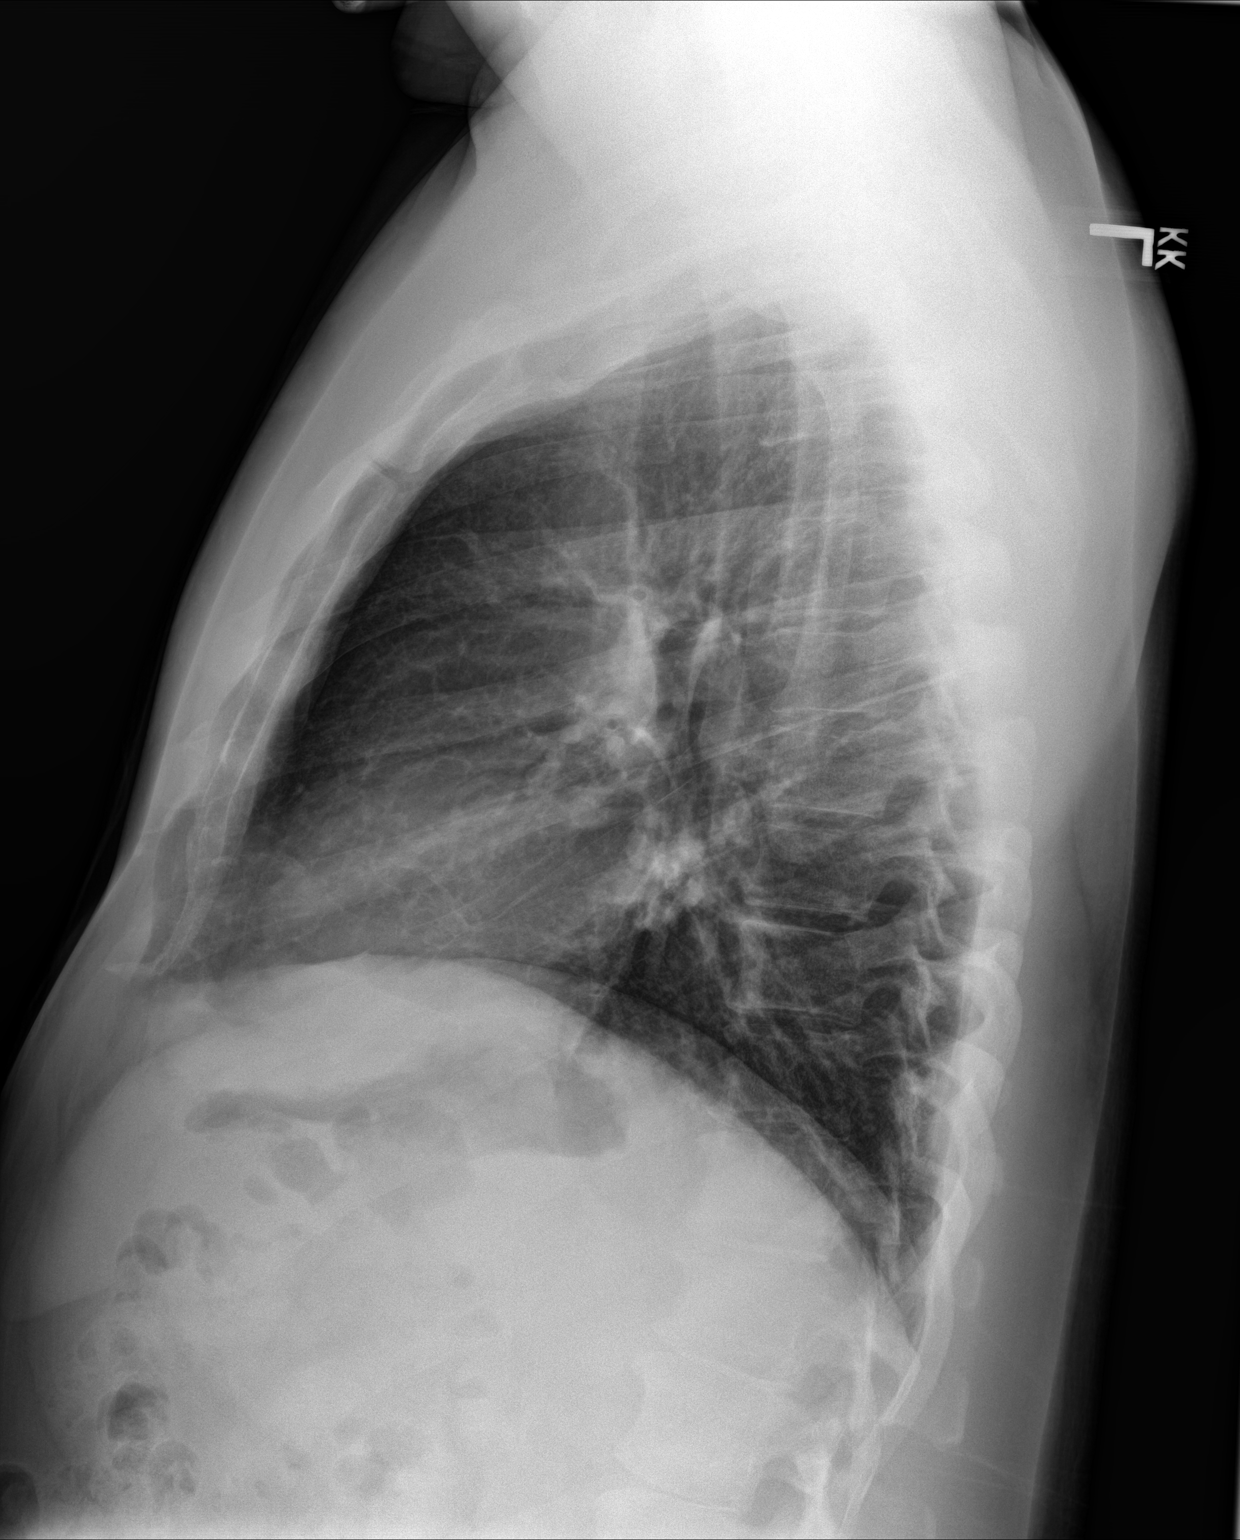

[2 of 2 positions shown; findings below may reference images not displayed]

FINDINGS: There is minimal left base atelectasis. Lungs elsewhere are clear.
The heart size and pulmonary vascularity are normal. No adenopathy.
No bone lesions.
IMPRESSION: Minimal left base atelectasis. Lungs elsewhere clear. Cardiac
silhouette within normal limits. No adenopathy.

## 2022-10-08 ENCOUNTER — Ambulatory Visit
Admission: EM | Admit: 2022-10-08 | Discharge: 2022-10-08 | Disposition: A | Discharge status: Home / Self Care | Payer: BC Managed Care – PPO | Attending: Family Medicine | Admitting: Family Medicine

## 2022-10-08 DIAGNOSIS — J069 Acute upper respiratory infection, unspecified: Secondary | ICD-10-CM | POA: Diagnosis not present

## 2022-10-08 DIAGNOSIS — R0602 Shortness of breath: Secondary | ICD-10-CM | POA: Insufficient documentation

## 2022-10-08 DIAGNOSIS — J111 Influenza due to unidentified influenza virus with other respiratory manifestations: Secondary | ICD-10-CM | POA: Diagnosis not present

## 2022-10-08 DIAGNOSIS — Z20822 Contact with and (suspected) exposure to covid-19: Secondary | ICD-10-CM | POA: Diagnosis not present

## 2022-10-08 LAB — RESP PANEL BY RT-PCR (RSV, FLU A&B, COVID)  RVPGX2
Influenza A by PCR: NEGATIVE
Influenza B by PCR: NEGATIVE
Resp Syncytial Virus by PCR: NEGATIVE
SARS Coronavirus 2 by RT PCR: NEGATIVE

## 2022-10-08 MED ORDER — PREDNISONE 10 MG (21) PO TBPK: ORAL_TABLET | Freq: Every day | ORAL | 0 refills | Status: DC

## 2022-10-08 MED ORDER — ALBUTEROL SULFATE HFA 108 (90 BASE) MCG/ACT IN AERS: 2.0000 | INHALATION_SPRAY | Freq: Four times a day (QID) | RESPIRATORY_TRACT | 2 refills | Status: AC | PRN

## 2022-10-08 MED ORDER — PROMETHAZINE-PHENYLEPHRINE 6.25-5 MG/5ML PO SYRP: 5.0000 mL | ORAL_SOLUTION | ORAL | 0 refills | Status: DC | PRN

## 2022-10-08 NOTE — ED Provider Notes (Signed)
[facility]    CSN: [id number] Arrival date & time: 10/08/22  1111      History   Chief Complaint Chief Complaint  Patient presents with   Chills    I have had a fever on and off since Friday night. I had a sore throat which has gone away but I feel congestion in my right ride that has not gone away and is giving me terrible pain - Entered by patient   Fever    HPI Alex Cruz is a 36 y.o. male.   HPI   Alex Cruz presents for   Since Friday, he started having chills, fatigue and sore throat. His sore throat has improved. Took Benadryl, Zrytec, Tylenol, Theraflu with some relief. Has throbbing headache and right ear pain. Had elevated temperature around 99 F about 2 days ago. Has productive cough and feels shortness of breath.  Has history of lung complications with COVID. Nausea but no vomiting or diarrhea.      Past Medical History:  Diagnosis Date   COVID-19 06/10/2020   No known health problems     Patient Active Problem List   Diagnosis Date Noted   History of herniated intervertebral disc 06/11/2017    History reviewed. No pertinent surgical history.     Home Medications    Prior to Admission medications   Medication Sig Start Date End Date Taking? Authorizing Provider  promethazine-phenylephrine (PROMETHAZINE VC) 6.25-5 MG/5ML SYRP Take 5 mLs by mouth every 4 (four) hours as needed for congestion. 10/08/22  Yes Rori Goar, DO  albuterol (VENTOLIN HFA) 108 (90 Base) MCG/ACT inhaler Inhale 2 puffs into the lungs every 6 (six) hours as needed for wheezing or shortness of breath. 10/08/22   Lissette Schenk, Ronnette Juniper, DO  montelukast (SINGULAIR) 10 MG tablet Take 1 tablet (10 mg total) by mouth at bedtime. 09/18/20   Malfi, Lupita Raider, FNP  predniSONE (STERAPRED UNI-PAK 21 TAB) 10 MG (21) TBPK tablet Take by mouth daily. Take 6 tabs by mouth daily for 1, then 5 tabs for 1 day, then 4 tabs for 1 day, then 3 tabs for 1 day, then 2 tabs for 1 day, then 1 tab  for 1 day. 10/08/22   Lyndee Hensen, DO  vitamin C (ASCORBIC ACID) 250 MG tablet Take 1 tablet (250 mg total) by mouth 2 (two) times daily. 06/21/20   Loletha Grayer, MD  Zinc 220 (50 Zn) MG CAPS Take 220 mg by mouth daily. 06/21/20   Loletha Grayer, MD    Family History Family History  Problem Relation Age of Onset   Healthy Mother    Hypertension Father    Heart attack Father     Social History Social History   Tobacco Use   Smoking status: Never   Smokeless tobacco: Never  Vaping Use   Vaping Use: Never used  Substance Use Topics   Alcohol use: Not Currently    Comment: social   Drug use: No     Allergies   Patient has no known allergies.   Review of Systems Review of Systems: negative unless otherwise stated in HPI.      Physical Exam Triage Vital Signs ED Triage Vitals [10/08/22 1241]  Enc Vitals Group     BP 127/80     Pulse Rate 100     Resp 16     Temp 98.4 F (36.9 C)     Temp Source Oral     SpO2 96 %     Weight  177 lb (80.3 kg)     Height      Head Circumference      Peak Flow      Pain Score      Pain Loc      Pain Edu?      Excl. in GC?    No data found.  Updated Vital Signs BP 127/80 (BP Location: Right Arm)   Pulse 100   Temp 98.4 F (36.9 C) (Oral)   Resp 16   Wt 80.3 kg   SpO2 96%   BMI 27.72 kg/m   Visual Acuity Right Eye Distance:   Left Eye Distance:   Bilateral Distance:    Right Eye Near:   Left Eye Near:    Bilateral Near:     Physical Exam GEN:     alert, non-toxic appearing male in no distress    HENT:  mucus membranes moist, oropharyngeal without lesions or exudate, no tonsillar hypertrophy, mild oropharyngeal erythema,  moderate erythematous edematous turbinates, clear nasal discharge, bilateral TM normal EYES:   pupils equal and reactive, no scleral injection or discharge NECK:  normal ROM, no lymphadenopathy, no meningismus   RESP:  no increased work of breathing, rhonchi diffuse CVS:   regular   rhythm, tachycardic Skin:   warm and dry, brisk cap refill    UC Treatments / Results  Labs (all labs ordered are listed, but only abnormal results are displayed) Labs Reviewed  RESP PANEL BY RT-PCR (RSV, FLU A&B, COVID)  [id number]    EKG   Radiology No results found.  Procedures Procedures (including critical care time)  Medications Ordered in UC Medications - No data to display  Initial Impression / Assessment and Plan / UC Course  I have reviewed the triage vital signs and the nursing notes.  Pertinent labs & imaging results that were available during my care of the patient were reviewed by me and considered in my medical decision making (see chart for details).       Pt is a 36 y.o. male who presents for 4 days of respiratory symptoms. Dal is afebrile here without recent antipyretics. He is tachycardic. Satting 91-96% on room air. Overall pt is non-toxic appearing, well hydrated, without respiratory distress. Pulmonary exam is unremarkable.  RSV, COVID and influenza testing obtained and were negative. History consistent with viral respiratory illness. Discussed symptomatic treatment.  Promethazine DM and albuterol for cough and bronchospasm.  He may have viral bronchitis but because he drops down to 91% with conversation gave steroid taper.  Explained lack of efficacy of antibiotics in viral disease.  Typical duration of symptoms discussed.   Return and ED precautions given and voiced understanding. Discussed MDM, treatment plan and plan for follow-up with patient who agrees with plan.     Final Clinical Impressions(s) / UC Diagnoses   Final diagnoses:  Shortness of breath  URI with cough and congestion  Encounter for laboratory testing for COVID-19 virus     Discharge Instructions      We will contact you if your COVID/influenza/RSV test is positive.  Please quarantine while you wait for the results.  If your test is negative you may resume normal activities.   If your test is positive please continue to quarantine for at least 5 days from your symptom onset or until you are without a fever for at least 24 hours after the medications.    If your were prescribed medication, stop by the pharmacy to pick them up.  You can take Tylenol and/or Ibuprofen as needed for fever reduction and pain relief.    For cough: honey 1/2 to 1 teaspoon (you can dilute the honey in water or another fluid).  You can also use guaifenesin and dextromethorphan for cough. You can use a humidifier for chest congestion and cough.  If you don't have a humidifier, you can sit in the bathroom with the hot shower running.      For sore throat: try warm salt water gargles, Mucinex sore throat cough drops or cepacol lozenges, throat spray, warm tea or water with lemon/honey, popsicles or ice, or OTC cold relief medicine for throat discomfort. You can also purchase chloraseptic spray at the pharmacy or dollar store.   For congestion: take a daily anti-histamine like Zyrtec, Claritin, and a oral decongestant, such as pseudoephedrine.  You can also use Flonase 1-2 sprays in each nostril daily. Afrin is also a good option, if you do not have high blood pressure.    It is important to stay hydrated: drink plenty of fluids (water, gatorade/powerade/pedialyte, juices, or teas) to keep your throat moisturized and help further relieve irritation/discomfort.    Return or go to the Emergency Department if symptoms worsen or do not improve in the next few days      ED Prescriptions     Medication Sig Dispense Auth. Provider   albuterol (VENTOLIN HFA) 108 (90 Base) MCG/ACT inhaler Inhale 2 puffs into the lungs every 6 (six) hours as needed for wheezing or shortness of breath. 8 g Nahome Bublitz, DO   promethazine-phenylephrine (PROMETHAZINE VC) 6.25-5 MG/5ML SYRP Take 5 mLs by mouth every 4 (four) hours as needed for congestion. 118 mL Rangel Echeverri, DO   predniSONE (STERAPRED UNI-PAK 21  TAB) 10 MG (21) TBPK tablet Take by mouth daily. Take 6 tabs by mouth daily for 1, then 5 tabs for 1 day, then 4 tabs for 1 day, then 3 tabs for 1 day, then 2 tabs for 1 day, then 1 tab for 1 day. 21 tablet Katha Cabal, DO      PDMP not reviewed this encounter.   Katha Cabal, DO 10/08/22 1527

## 2022-10-08 NOTE — Discharge Instructions (Signed)
We will contact you if your COVID/influenza/RSV test is positive.  Please quarantine while you wait for the results.  If your test is negative you may resume normal activities.  If your test is positive please continue to quarantine for at least 5 days from your symptom onset or until you are without a fever for at least 24 hours after the medications.    If your were prescribed medication, stop by the pharmacy to pick them up.   You can take Tylenol and/or Ibuprofen as needed for fever reduction and pain relief.    For cough: honey 1/2 to 1 teaspoon (you can dilute the honey in water or another fluid).  You can also use guaifenesin and dextromethorphan for cough. You can use a humidifier for chest congestion and cough.  If you don't have a humidifier, you can sit in the bathroom with the hot shower running.      For sore throat: try warm salt water gargles, Mucinex sore throat cough drops or cepacol lozenges, throat spray, warm tea or water with lemon/honey, popsicles or ice, or OTC cold relief medicine for throat discomfort. You can also purchase chloraseptic spray at the pharmacy or dollar store.   For congestion: take a daily anti-histamine like Zyrtec, Claritin, and a oral decongestant, such as pseudoephedrine.  You can also use Flonase 1-2 sprays in each nostril daily. Afrin is also a good option, if you do not have high blood pressure.    It is important to stay hydrated: drink plenty of fluids (water, gatorade/powerade/pedialyte, juices, or teas) to keep your throat moisturized and help further relieve irritation/discomfort.    Return or go to the Emergency Department if symptoms worsen or do not improve in the next few days  

## 2022-10-08 NOTE — ED Triage Notes (Addendum)
Patient presents to UC for chills, night sweats, right sided facial pain, nasal congestion  x 4 days. Temp ranging in the 99.0. Treating symptoms with zyrtec, tylenol, theraflu tea, and benadryl.

## 2022-10-18 ENCOUNTER — Ambulatory Visit (INDEPENDENT_AMBULATORY_CARE_PROVIDER_SITE_OTHER): Payer: BC Managed Care – PPO | Admitting: Internal Medicine

## 2022-10-18 ENCOUNTER — Encounter: Payer: Self-pay | Admitting: Internal Medicine

## 2022-10-18 VITALS — BP 116/73 | HR 96 | Temp 98.2°F | Resp 17 | Ht 67.0 in | Wt 182.6 lb

## 2022-10-18 DIAGNOSIS — J0100 Acute maxillary sinusitis, unspecified: Secondary | ICD-10-CM | POA: Diagnosis not present

## 2022-10-18 DIAGNOSIS — Z23 Encounter for immunization: Secondary | ICD-10-CM

## 2022-10-18 DIAGNOSIS — J069 Acute upper respiratory infection, unspecified: Secondary | ICD-10-CM

## 2022-10-18 MED ORDER — BENZONATATE 200 MG PO CAPS: 200.0000 mg | ORAL_CAPSULE | Freq: Three times a day (TID) | ORAL | 0 refills | Status: DC | PRN

## 2022-10-18 MED ORDER — AMOXICILLIN-POT CLAVULANATE 875-125 MG PO TABS: 1.0000 | ORAL_TABLET | Freq: Two times a day (BID) | ORAL | 0 refills | Status: DC

## 2022-10-18 NOTE — Progress Notes (Signed)
Subjective:    Patient ID: Alex Cruz, male    DOB: August 06, 1986, 36 y.o.   MRN: [id number]  HPI  Patient presents to clinic today for ER follow-up.  He presented to the ER 12/19 with complaint of respiratory symptoms.  No chest x-ray was obtained.  He was negative for flu, COVID and RSV.  He was treated with Albuterol, Prednisone and Promethazine DM cough syrup.  Since that time, he reports  fatigue, headache, nasal congestion and cough. He describes the headache as pressure. He is blowing yellow mucous out of his nose. He denies runny nose, ear pain, sore throat, shortness of breath, nausea, vomiting or diarrhea. He does have chills but denies fever or body aches. He has not tried anything additional OTC. He has not had sick contacts that he is aware of. He does not smoke.   Review of Systems     Past Medical History:  Diagnosis Date   COVID-19 06/10/2020   No known health problems     Current Outpatient Medications  Medication Sig Dispense Refill   albuterol (VENTOLIN HFA) 108 (90 Base) MCG/ACT inhaler Inhale 2 puffs into the lungs every 6 (six) hours as needed for wheezing or shortness of breath. 8 g 2   montelukast (SINGULAIR) 10 MG tablet Take 1 tablet (10 mg total) by mouth at bedtime. 90 tablet 3   predniSONE (STERAPRED UNI-PAK 21 TAB) 10 MG (21) TBPK tablet Take by mouth daily. Take 6 tabs by mouth daily for 1, then 5 tabs for 1 day, then 4 tabs for 1 day, then 3 tabs for 1 day, then 2 tabs for 1 day, then 1 tab for 1 day. 21 tablet 0   promethazine-phenylephrine (PROMETHAZINE VC) 6.25-5 MG/5ML SYRP Take 5 mLs by mouth every 4 (four) hours as needed for congestion. 118 mL 0   vitamin C (ASCORBIC ACID) 250 MG tablet Take 1 tablet (250 mg total) by mouth 2 (two) times daily. 60 tablet 0   Zinc 220 (50 Zn) MG CAPS Take 220 mg by mouth daily. 30 capsule 0   No current facility-administered medications for this visit.    No Known Allergies  Family History  Problem Relation  Age of Onset   Healthy Mother    Hypertension Father    Heart attack Father     Social History   Socioeconomic History   Marital status: Single    Spouse name: Not on file   Number of children: Not on file   Years of education: Not on file   Highest education level: Not on file  Occupational History   Not on file  Tobacco Use   Smoking status: Never   Smokeless tobacco: Never  Vaping Use   Vaping Use: Never used  Substance and Sexual Activity   Alcohol use: Not Currently    Comment: social   Drug use: No   Sexual activity: Not on file  Other Topics Concern   Not on file  Social History Narrative   Not on file   Social Determinants of Health   Financial Resource Strain: Not on file  Food Insecurity: Not on file  Transportation Needs: Not on file  Physical Activity: Not on file  Stress: Not on file  Social Connections: Not on file  Intimate Partner Violence: Not on file     Constitutional: Patient reports headache.  Denies fever, malaise, fatigue, or abrupt weight changes.  HEENT: Patient reports sinus pressure and nasal congestion.  Denies eye pain,  eye redness, ear pain, ringing in the ears, wax buildup, runny nose, bloody nose, or sore throat. Respiratory: Patient reports cough.  Denies difficulty breathing, shortness of breath, or sputum production.   Cardiovascular: Denies chest pain, chest tightness, palpitations or swelling in the hands or feet.  Gastrointestinal: Denies abdominal pain, bloating, constipation, diarrhea or blood in the stool.   No other specific complaints in a complete review of systems (except as listed in HPI above).  Objective:   Physical Exam  BP 116/73 (BP Location: Left Arm, Patient Position: Sitting, Cuff Size: Normal)   Pulse 96   Temp 98.2 F (36.8 C) (Oral)   Resp 17   Ht 5\' 7"  (1.702 m)   Wt 182 lb 9.6 oz (82.8 kg)   SpO2 99%   BMI 28.60 kg/m   Wt Readings from Last 3 Encounters:  10/08/22 177 lb (80.3 kg)  11/08/20  176 lb 6.4 oz (80 kg)  10/30/20 180 lb 9.6 oz (81.9 kg)    General: Appears his stated age, overweight in NAD. Skin: Warm, dry and intact. No rashes noted. HEENT: Head: normal shape and size, right maxillary sinus tenderness noted; Eyes: sclera white, no icterus, conjunctiva pink, PERRLA and EOMs intact;  Nose: mucosa pink and moist, septum midline; Throat/Mouth: Teeth present, mucosa erythematous and moist, no exudate, lesions or ulcerations noted.  Neck: No adenopathy noted. Cardiovascular: Normal rate and rhythm. S1,S2 noted.  No murmur, rubs or gallops noted.  Pulmonary/Chest: Normal effort and positive vesicular breath sounds. No respiratory distress. No wheezes, rales or ronchi noted.  Neurological: Alert and oriented.   BMET    Component Value Date/Time   NA 140 09/18/2020 0920   K 3.8 09/18/2020 0920   CL 103 09/18/2020 0920   CO2 29 09/18/2020 0920   GLUCOSE 85 09/18/2020 0920   BUN 9 09/18/2020 0920   CREATININE 0.82 09/18/2020 0920   CALCIUM 9.5 09/18/2020 0920   GFRNONAA 116 09/18/2020 0920   GFRAA 135 09/18/2020 0920    Lipid Panel  No results found for: "CHOL", "TRIG", "HDL", "CHOLHDL", "VLDL", "LDLCALC"  CBC    Component Value Date/Time   WBC 6.4 09/18/2020 0920   RBC 5.33 09/18/2020 0920   HGB 16.8 09/18/2020 0920   HCT 47.7 09/18/2020 0920   PLT 272 09/18/2020 0920   MCV 89.5 09/18/2020 0920   MCH 31.5 09/18/2020 0920   MCHC 35.2 09/18/2020 0920   RDW 11.9 09/18/2020 0920   LYMPHSABS 2,067 09/18/2020 0920   MONOABS 0.2 06/17/2020 1627   EOSABS 109 09/18/2020 0920   BASOSABS 58 09/18/2020 0920    Hgb A1C No results found for: "HGBA1C"          Assessment & Plan:   ER Follow-up for Viral URI:  ER notes and labs reviewed He is still symptomatic after 2 weeks of symptoms I think it is reasonable at this time to treat for sinus infection with Augmentin 875-125 mg twice daily x 10 days Encourage rest and fluids Zyrtec and Flonase OTC may be  helpful as well   Encouraged him to schedule an appointment for his annual exam 09/20/2020, NP

## 2022-10-18 NOTE — Patient Instructions (Signed)

## 2022-10-29 ENCOUNTER — Ambulatory Visit (INDEPENDENT_AMBULATORY_CARE_PROVIDER_SITE_OTHER): Payer: BC Managed Care – PPO | Admitting: Internal Medicine

## 2022-10-29 ENCOUNTER — Encounter: Payer: Self-pay | Admitting: Internal Medicine

## 2022-10-29 VITALS — BP 116/78 | HR 81 | Temp 96.5°F | Ht 67.0 in | Wt 185.0 lb

## 2022-10-29 DIAGNOSIS — Z0001 Encounter for general adult medical examination with abnormal findings: Secondary | ICD-10-CM | POA: Diagnosis not present

## 2022-10-29 DIAGNOSIS — Z23 Encounter for immunization: Secondary | ICD-10-CM

## 2022-10-29 DIAGNOSIS — Z1159 Encounter for screening for other viral diseases: Secondary | ICD-10-CM

## 2022-10-29 DIAGNOSIS — E663 Overweight: Secondary | ICD-10-CM | POA: Diagnosis not present

## 2022-10-29 DIAGNOSIS — R7309 Other abnormal glucose: Secondary | ICD-10-CM | POA: Diagnosis not present

## 2022-10-29 DIAGNOSIS — Z6828 Body mass index (BMI) 28.0-28.9, adult: Secondary | ICD-10-CM

## 2022-10-29 NOTE — Addendum Note (Signed)
Addended by: Jearld Fenton on: 10/29/2022 08:23 AM   Modules accepted: Level of Service

## 2022-10-29 NOTE — Progress Notes (Signed)
Subjective:    Patient ID: Alex Cruz, male    DOB: [birth year], 37 y.o.   MRN: [id number]  HPI  Patient presents to clinic today for his annual exam.  Flu: never Tetanus: < 10 years ago COVID: Moderna x 1 Dentist: biannually  Diet: He does eat meat. He consumes some fruits and veggies. He does eat some fried foods. He drinks mostly water, soda. Exercise: Running  Review of Systems     Past Medical History:  Diagnosis Date   COVID-19 06/10/2020   No known health problems     Current Outpatient Medications  Medication Sig Dispense Refill   acetaminophen (TYLENOL) 500 MG tablet Take 500 mg by mouth every 6 (six) hours as needed.     albuterol (VENTOLIN HFA) 108 (90 Base) MCG/ACT inhaler Inhale 2 puffs into the lungs every 6 (six) hours as needed for wheezing or shortness of breath. 8 g 2   amoxicillin-clavulanate (AUGMENTIN) 875-125 MG tablet Take 1 tablet by mouth 2 (two) times daily. 20 tablet 0   benzonatate (TESSALON) 200 MG capsule Take 1 capsule (200 mg total) by mouth 3 (three) times daily as needed. 30 capsule 0   No current facility-administered medications for this visit.    No Known Allergies  Family History  Problem Relation Age of Onset   Healthy Mother    Hypertension Father    Heart attack Father     Social History   Socioeconomic History   Marital status: Single    Spouse name: Not on file   Number of children: Not on file   Years of education: Not on file   Highest education level: Not on file  Occupational History   Not on file  Tobacco Use   Smoking status: Never   Smokeless tobacco: Never  Vaping Use   Vaping Use: Never used  Substance and Sexual Activity   Alcohol use: Not Currently    Comment: social   Drug use: No   Sexual activity: Not on file  Other Topics Concern   Not on file  Social History Narrative   Not on file   Social Determinants of Health   Financial Resource Strain: Not on file  Food Insecurity: Not on  file  Transportation Needs: Not on file  Physical Activity: Not on file  Stress: Not on file  Social Connections: Not on file  Intimate Partner Violence: Not on file     Constitutional: Denies fever, malaise, fatigue, headache or abrupt weight changes.  HEENT: Denies eye pain, eye redness, ear pain, ringing in the ears, wax buildup, runny nose, nasal congestion, bloody nose, or sore throat. Respiratory: Denies difficulty breathing, shortness of breath, cough or sputum production.   Cardiovascular: Denies chest pain, chest tightness, palpitations or swelling in the hands or feet.  Gastrointestinal: Denies abdominal pain, bloating, constipation, diarrhea or blood in the stool.  GU: Denies urgency, frequency, pain with urination, burning sensation, blood in urine, odor or discharge. Musculoskeletal: Patient reports intermittent low back pain.  Denies decrease in range of motion, difficulty with gait, muscle pain or joint swelling.  Skin: Denies redness, rashes, lesions or ulcercations.  Neurological: Denies dizziness, difficulty with memory, difficulty with speech or problems with balance and coordination.  Psych: Denies anxiety, depression, SI/HI.  No other specific complaints in a complete review of systems (except as listed in HPI above).  Objective:   Physical Exam  BP 116/78 (BP Location: Right Arm, Patient Position: Sitting, Cuff Size: Normal)  Pulse 81   Temp (!) 96.5 F (35.8 C) (Temporal)   Ht 5\' 7"  (1.702 m)   Wt 185 lb (83.9 kg)   SpO2 98%   BMI 28.98 kg/m   Wt Readings from Last 3 Encounters:  10/18/22 182 lb 9.6 oz (82.8 kg)  10/08/22 177 lb (80.3 kg)  11/08/20 176 lb 6.4 oz (80 kg)    General: Appears his stated age, overweight, in NAD. Skin: Warm, dry and intact. HEENT: Head: normal shape and size; Eyes: sclera white, no icterus, conjunctiva pink, PERRLA and EOMs intact;  Neck:  Neck supple, trachea midline. No masses, lumps or thyromegaly present.   Cardiovascular: Normal rate and rhythm. S1,S2 noted.  No murmur, rubs or gallops noted. No JVD or BLE edema.  Pulmonary/Chest: Normal effort and positive vesicular breath sounds. No respiratory distress. No wheezes, rales or ronchi noted.  Abdomen: Normal bowel sounds.  Musculoskeletal: Strength 5/5 BUE/BLE.  No difficulty with gait.  Neurological: Alert and oriented. Cranial nerves II-XII grossly intact. Coordination normal.  Psychiatric: Mood and affect normal. Behavior is normal. Judgment and thought content normal.    BMET    Component Value Date/Time   NA 140 09/18/2020 0920   K 3.8 09/18/2020 0920   CL 103 09/18/2020 0920   CO2 29 09/18/2020 0920   GLUCOSE 85 09/18/2020 0920   BUN 9 09/18/2020 0920   CREATININE 0.82 09/18/2020 0920   CALCIUM 9.5 09/18/2020 0920   GFRNONAA 116 09/18/2020 0920   GFRAA 135 09/18/2020 0920    Lipid Panel  No results found for: "CHOL", "TRIG", "HDL", "CHOLHDL", "VLDL", "LDLCALC"  CBC    Component Value Date/Time   WBC 6.4 09/18/2020 0920   RBC 5.33 09/18/2020 0920   HGB 16.8 09/18/2020 0920   HCT 47.7 09/18/2020 0920   PLT 272 09/18/2020 0920   MCV 89.5 09/18/2020 0920   MCH 31.5 09/18/2020 0920   MCHC 35.2 09/18/2020 0920   RDW 11.9 09/18/2020 0920   LYMPHSABS 2,067 09/18/2020 0920   MONOABS 0.2 06/17/2020 1627   EOSABS 109 09/18/2020 0920   BASOSABS 58 09/18/2020 0920    Hgb A1C No results found for: "HGBA1C"          Assessment & Plan:   Preventative Health Maintenance:  Flu shot today He declines tetanus today Encouraged to get his COVID booster Encouraged him to consume a balanced diet and exercise regimen Advised him to see an eye doctor and dentist annually We will check CBC, c-Met, lipid, A1c and hep C today  RTC in 1 year, sooner if needed Webb Silversmith, NP

## 2022-10-29 NOTE — Addendum Note (Signed)
Addended by: Ashley Royalty E on: 10/29/2022 08:39 AM   Modules accepted: Orders

## 2022-10-29 NOTE — Patient Instructions (Signed)
Health Maintenance, Male Adopting a healthy lifestyle and getting preventive care are important in promoting health and wellness. Ask your health care provider about: The right schedule for you to have regular tests and exams. Things you can do on your own to prevent diseases and keep yourself healthy. What should I know about diet, weight, and exercise? Eat a healthy diet  Eat a diet that includes plenty of vegetables, fruits, low-fat dairy products, and lean protein. Do not eat a lot of foods that are high in solid fats, added sugars, or sodium. Maintain a healthy weight Body mass index (BMI) is a measurement that can be used to identify possible weight problems. It estimates body fat based on height and weight. Your health care provider can help determine your BMI and help you achieve or maintain a healthy weight. Get regular exercise Get regular exercise. This is one of the most important things you can do for your health. Most adults should: Exercise for at least 150 minutes each week. The exercise should increase your heart rate and make you sweat (moderate-intensity exercise). Do strengthening exercises at least twice a week. This is in addition to the moderate-intensity exercise. Spend less time sitting. Even light physical activity can be beneficial. Watch cholesterol and blood lipids Have your blood tested for lipids and cholesterol at 36 years of age, then have this test every 5 years. You may need to have your cholesterol levels checked more often if: Your lipid or cholesterol levels are high. You are older than 36 years of age. You are at high risk for heart disease. What should I know about cancer screening? Many types of cancers can be detected early and may often be prevented. Depending on your health history and family history, you may need to have cancer screening at various ages. This may include screening for: Colorectal cancer. Prostate cancer. Skin cancer. Lung  cancer. What should I know about heart disease, diabetes, and high blood pressure? Blood pressure and heart disease High blood pressure causes heart disease and increases the risk of stroke. This is more likely to develop in people who have high blood pressure readings or are overweight. Talk with your health care provider about your target blood pressure readings. Have your blood pressure checked: Every 3-5 years if you are 18-39 years of age. Every year if you are 40 years old or older. If you are between the ages of 65 and 75 and are a current or former smoker, ask your health care provider if you should have a one-time screening for abdominal aortic aneurysm (AAA). Diabetes Have regular diabetes screenings. This checks your fasting blood sugar level. Have the screening done: Once every three years after age 45 if you are at a normal weight and have a low risk for diabetes. More often and at a younger age if you are overweight or have a high risk for diabetes. What should I know about preventing infection? Hepatitis B If you have a higher risk for hepatitis B, you should be screened for this virus. Talk with your health care provider to find out if you are at risk for hepatitis B infection. Hepatitis C Blood testing is recommended for: Everyone born from 1945 through 1965. Anyone with known risk factors for hepatitis C. Sexually transmitted infections (STIs) You should be screened each year for STIs, including gonorrhea and chlamydia, if: You are sexually active and are younger than 36 years of age. You are older than 36 years of age and your   health care provider tells you that you are at risk for this type of infection. Your sexual activity has changed since you were last screened, and you are at increased risk for chlamydia or gonorrhea. Ask your health care provider if you are at risk. Ask your health care provider about whether you are at high risk for HIV. Your health care provider  may recommend a prescription medicine to help prevent HIV infection. If you choose to take medicine to prevent HIV, you should first get tested for HIV. You should then be tested every 3 months for as long as you are taking the medicine. Follow these instructions at home: Alcohol use Do not drink alcohol if your health care provider tells you not to drink. If you drink alcohol: Limit how much you have to 0-2 drinks a day. Know how much alcohol is in your drink. In the [place] one drink equals one 12 oz bottle of beer (355 mL), one 5 oz glass of wine (148 mL), or one 1 oz glass of hard liquor (44 mL). Lifestyle Do not use any products that contain nicotine or tobacco. These products include cigarettes, chewing tobacco, and vaping devices, such as e-cigarettes. If you need help quitting, ask your health care provider. Do not use street drugs. Do not share needles. Ask your health care provider for help if you need support or information about quitting drugs. General instructions Schedule regular health, dental, and eye exams. Stay current with your vaccines. Tell your health care provider if: You often feel depressed. You have ever been abused or do not feel safe at home. Summary Adopting a healthy lifestyle and getting preventive care are important in promoting health and wellness. Follow your health care provider's instructions about healthy diet, exercising, and getting tested or screened for diseases. Follow your health care provider's instructions on monitoring your cholesterol and blood pressure. This information is not intended to replace advice given to you by your health care provider. Make sure you discuss any questions you have with your health care provider. Document Revised: 02/26/2021 Document Reviewed: 02/26/2021 Elsevier Patient Education  2023 [employer]  

## 2022-10-29 NOTE — Assessment & Plan Note (Signed)
Encourage diet and exercise for weight loss 

## 2022-10-30 LAB — CBC
HCT: 48.6 % (ref 38.5–50.0)
Hemoglobin: 17.3 g/dL — ABNORMAL HIGH (ref 13.2–17.1)
MCH: 31.7 pg (ref 27.0–33.0)
MCHC: 35.6 g/dL (ref 32.0–36.0)
MCV: 89.2 fL (ref 80.0–100.0)
MPV: 9.9 fL (ref 7.5–12.5)
Platelets: 237 10*3/uL (ref 140–400)
RBC: 5.45 10*6/uL (ref 4.20–5.80)
RDW: 12.7 % (ref 11.0–15.0)
WBC: 8.2 10*3/uL (ref 3.8–10.8)

## 2022-10-30 LAB — COMPLETE METABOLIC PANEL WITH GFR
AG Ratio: 1.7 (calc) (ref 1.0–2.5)
ALT: 22 U/L (ref 9–46)
AST: 14 U/L (ref 10–40)
Albumin: 4.2 g/dL (ref 3.6–5.1)
Alkaline phosphatase (APISO): 59 U/L (ref 36–130)
BUN: 17 mg/dL (ref 7–25)
CO2: 26 mmol/L (ref 20–32)
Calcium: 9.3 mg/dL (ref 8.6–10.3)
Chloride: 104 mmol/L (ref 98–110)
Creat: 0.87 mg/dL (ref 0.60–1.26)
Globulin: 2.5 g/dL (calc) (ref 1.9–3.7)
Glucose, Bld: 88 mg/dL (ref 65–99)
Potassium: 4.5 mmol/L (ref 3.5–5.3)
Sodium: 138 mmol/L (ref 135–146)
Total Bilirubin: 0.6 mg/dL (ref 0.2–1.2)
Total Protein: 6.7 g/dL (ref 6.1–8.1)
eGFR: 115 mL/min/{1.73_m2} (ref >=60)

## 2022-10-30 LAB — HEMOGLOBIN A1C
Hgb A1c MFr Bld: 5.4 % of total Hgb (ref <5.7)
Mean Plasma Glucose: 108 mg/dL
eAG (mmol/L): 6 mmol/L

## 2022-10-30 LAB — LIPID PANEL
Cholesterol: 189 mg/dL (ref <200)
HDL: 44 mg/dL (ref >=40)
LDL Cholesterol (Calc): 121 mg/dL (calc) — ABNORMAL HIGH
Non-HDL Cholesterol (Calc): 145 mg/dL (calc) — ABNORMAL HIGH (ref <130)
Total CHOL/HDL Ratio: 4.3 (calc) (ref <5.0)
Triglycerides: 129 mg/dL (ref <150)

## 2022-10-30 LAB — HEPATITIS C ANTIBODY: Hepatitis C Ab: NONREACTIVE

## 2023-04-23 ENCOUNTER — Ambulatory Visit
Admission: RE | Admit: 2023-04-23 | Discharge: 2023-04-23 | Disposition: A | Discharge status: Home / Self Care | Payer: BC Managed Care – PPO | Source: Ambulatory Visit | Attending: Emergency Medicine | Admitting: Emergency Medicine

## 2023-04-23 ENCOUNTER — Ambulatory Visit: Payer: Self-pay

## 2023-04-23 VITALS — BP 161/96 | HR 79 | Temp 99.1°F

## 2023-04-23 DIAGNOSIS — R350 Frequency of micturition: Secondary | ICD-10-CM

## 2023-04-23 LAB — URINALYSIS, W/ REFLEX TO CULTURE (INFECTION SUSPECTED)
Bacteria, UA: NONE SEEN
Bilirubin Urine: NEGATIVE
Glucose, UA: NEGATIVE mg/dL
Hgb urine dipstick: NEGATIVE
Ketones, ur: NEGATIVE mg/dL
Leukocytes,Ua: NEGATIVE
Nitrite: NEGATIVE
Protein, ur: NEGATIVE mg/dL
RBC / HPF: NONE SEEN RBC/hpf (ref 0–5)
Specific Gravity, Urine: 1.01 (ref 1.005–1.030)
pH: 7 (ref 5.0–8.0)

## 2023-04-23 MED ORDER — PHENAZOPYRIDINE HCL 200 MG PO TABS: 200.0000 mg | ORAL_TABLET | Freq: Three times a day (TID) | ORAL | 0 refills | Status: DC

## 2023-04-23 NOTE — ED Triage Notes (Addendum)
Pt presents to UC c/o urinary frequency onset x couple weeks ago, pt has also noticed excessive thrist. Pt states he feels a lot of urgency and has to go almost immediately after drinking anything. Pt reports all this began with abdominal pain that has now subsided. Pt denies any concern for any STDs, denies any hx of diabetes.

## 2023-04-23 NOTE — Telephone Encounter (Signed)
noted 

## 2023-04-23 NOTE — ED Provider Notes (Signed)
[facility]    CSN: [id number] Arrival date & time: 04/23/23  1620      History   Chief Complaint Chief Complaint  Patient presents with   Urinary Frequency    Entered by patient    HPI SUJAL ALDAMA is a 37 y.o. male.   HPI  37 year old male with no significant past medical history presents for evaluation of 2 weeks worth of urinary urgency and frequency with associated excessive thirst.  He denies any pain with urination or blood in his urine.  He has not noticed any change in his urine stream strength or low back pain.  There is no family history of diabetes.  Patient had blood work in January which showed a normal glucose of 88 and a normal hemoglobin A1c of 5.4.  Patient denies concerns for STIs.  Past Medical History:  Diagnosis Date   COVID-19 06/10/2020   No known health problems     Patient Active Problem List   Diagnosis Date Noted   Overweight with body mass index (BMI) of 28 to 28.9 in adult 10/29/2022   Chronic low back pain 06/11/2017    History reviewed. No pertinent surgical history.     Home Medications    Prior to Admission medications   Medication Sig Start Date End Date Taking? Authorizing Provider  phenazopyridine (PYRIDIUM) 200 MG tablet Take 1 tablet (200 mg total) by mouth 3 (three) times daily. 04/23/23  Yes Becky Augusta, NP  acetaminophen (TYLENOL) 500 MG tablet Take 500 mg by mouth every 6 (six) hours as needed.    [provider]  albuterol (VENTOLIN HFA) 108 (90 Base) MCG/ACT inhaler Inhale 2 puffs into the lungs every 6 (six) hours as needed for wheezing or shortness of breath. 10/08/22   Katha Cabal, DO    Family History Family History  Problem Relation Age of Onset   Healthy Mother    Hypertension Father    Heart attack Father    Colon cancer Neg Hx    Prostate cancer Neg Hx     Social History Social History   Tobacco Use   Smoking status: Never   Smokeless tobacco: Never  Vaping Use   Vaping  Use: Never used  Substance Use Topics   Alcohol use: Not Currently    Comment: social   Drug use: No     Allergies   Patient has no known allergies.   Review of Systems Review of Systems  Constitutional:  Negative for fever.  Genitourinary:  Positive for frequency and urgency. Negative for decreased urine volume, difficulty urinating, dysuria, hematuria and penile discharge.  Musculoskeletal:  Negative for back pain.     Physical Exam Triage Vital Signs ED Triage Vitals  Enc Vitals Group     BP      Pulse      Resp      Temp      Temp src      SpO2      Weight      Height      Head Circumference      Peak Flow      Pain Score      Pain Loc      Pain Edu?      Excl. in GC?    No data found.  Updated Vital Signs BP (!) 161/96 (BP Location: Right Arm)   Pulse 79   Temp 99.1 F (37.3 C) (Oral)   SpO2 96%   Visual Acuity  Right Eye Distance:   Left Eye Distance:   Bilateral Distance:    Right Eye Near:   Left Eye Near:    Bilateral Near:     Physical Exam Vitals and nursing note reviewed.  Constitutional:      Appearance: Normal appearance. He is not ill-appearing.  HENT:     Head: Normocephalic and atraumatic.  Cardiovascular:     Rate and Rhythm: Normal rate and regular rhythm.     Pulses: Normal pulses.     Heart sounds: Normal heart sounds. No murmur heard.    No friction rub. No gallop.  Pulmonary:     Effort: Pulmonary effort is normal.     Breath sounds: Normal breath sounds. No wheezing, rhonchi or rales.  Abdominal:     Tenderness: There is no right CVA tenderness or left CVA tenderness.  Skin:    General: Skin is warm and dry.     Capillary Refill: Capillary refill takes less than 2 seconds.     Findings: No rash.  Neurological:     General: No focal deficit present.     Mental Status: He is alert and oriented to person, place, and time.      UC Treatments / Results  Labs (all labs ordered are listed, but only abnormal results  are displayed) Labs Reviewed  URINALYSIS, W/ REFLEX TO CULTURE (INFECTION SUSPECTED)    EKG   Radiology No results found.  Procedures Procedures (including critical care time)  Medications Ordered in UC Medications - No data to display  Initial Impression / Assessment and Plan / UC Course  I have reviewed the triage vital signs and the nursing notes.  Pertinent labs & imaging results that were available during my care of the patient were reviewed by me and considered in my medical decision making (see chart for details).   Patient is a nontoxic-appearing 37 year old male presenting for evaluation of urinary complaints as outlined in HPI above.  He denies any pain with urination but his symptoms consist of urinary urgency and frequency along with polydipsia.  No weight loss or fevers.  Patient does have elevated temp in clinic of 99.1 as well as an elevated blood pressure of 161/96.  Pulse rate is normal at 79 and oxygen saturation is also normal at 96%.  Patient has no CVA tenderness on exam.  No family history of diabetes and with his recent blood work it is very reassuring that this is potentially not related to diabetes.  I will order urinalysis to evaluate for the presence of UTI.  Urinalysis is completely unremarkable.  I will discharge patient home with a diagnosis of urinary urgency and frequency and refer him to urology for further evaluation.  I will prescribe Pyridium that he can use every 8 hours as needed for his urinary symptoms.  Final Clinical Impressions(s) / UC Diagnoses   Final diagnoses:  Urinary frequency     Discharge Instructions      Your urinalysis did not show any signs of infection or contain any glucose which would be concerning for possible diabetes.  This, coupled with your recent blood work, is very reassuring that diabetes is not the cause of your symptoms.  Your excessive thirst may be secondary to the fact that you are working outdoors.  Try  supplementing your oral fluid intake with electrolyte solutions such as liquid IV, Gatorade, or Powerade to see if this improves your symptoms.  I am going to refer you to urology for further  evaluation of urinary urgency and frequency.  There are several conditions which can cause this that are not related to diabetes or urinary tract infection to include pelvic floor dysfunction or interstitial cystitis.  Use the Pyridium that I have prescribed for you every 8 hours to help with your urinary symptoms.  This will turn your urine a very vivid red-orange.  Return for reevaluation, or see your PCP, for any continued or worsening symptoms.     ED Prescriptions     Medication Sig Dispense Auth. Provider   phenazopyridine (PYRIDIUM) 200 MG tablet Take 1 tablet (200 mg total) by mouth 3 (three) times daily. 30 tablet Becky Augusta, NP      PDMP not reviewed this encounter.   Becky Augusta, NP 04/23/23 1659

## 2023-04-23 NOTE — Discharge Instructions (Addendum)
Your urinalysis did not show any signs of infection or contain any glucose which would be concerning for possible diabetes.  This, coupled with your recent blood work, is very reassuring that diabetes is not the cause of your symptoms.  Your excessive thirst may be secondary to the fact that you are working outdoors.  Try supplementing your oral fluid intake with electrolyte solutions such as liquid IV, Gatorade, or Powerade to see if this improves your symptoms.  I am going to refer you to urology for further evaluation of urinary urgency and frequency.  There are several conditions which can cause this that are not related to diabetes or urinary tract infection to include pelvic floor dysfunction or interstitial cystitis.  Use the Pyridium that I have prescribed for you every 8 hours to help with your urinary symptoms.  This will turn your urine a very vivid red-orange.  Return for reevaluation, or see your PCP, for any continued or worsening symptoms.

## 2023-04-23 NOTE — Telephone Encounter (Signed)
Chief Complaint: Frequent urination Symptoms: Frequent urination and extreme thirst  Frequency: Ongoing for about 2 weeks  Pertinent Negatives: Patient denies Burning, pain and other symptoms Disposition: [] ED /[x] Urgent Care (no appt availability in office) / [] Appointment(In office/virtual)/ []  [place] [facility]/ [] Home Care/ [] Refused Recommended Disposition /[] [place] [facility]/ []  Follow-up with PCP Additional Notes: Patient reports frequent urination that started about 2 weeks ago and shortly after he noticed extreme thirst symptoms. Patient denies a history of diabetes to his knowledge. Patient states he is going to urinate about 1 hour after drinking any fluids and he is waking up throughout the night to go to the restroom to urinate. Advised patient that he would need to be seen for further evaluation. Patient agreeable. No appointments were available in the office. Patient has been schedule at [facility] today at 1700.   Summary: Thrist & Urination Advice   Patient is calling to report that he is extremely thristy, and frequent urination. Pt would like to be evaluated soon. No available appts. Please advise     Reason for Disposition  Urinating more frequently than usual (i.e., frequency)  Answer Assessment - Initial Assessment Questions 1. SYMPTOM: "What's the main symptom you're concerned about?" (e.g., frequency, incontinence)     Frequent urination 2. ONSET: "When did the  frequent urination  start?"     2 and half weeks ago 3. PAIN: "Is there any pain?" If Yes, ask: "How bad is it?" (Scale: 1-10; mild, moderate, severe)     No, I had some abdominal pain about 2 weeks ago but that has stop.  4. CAUSE: "What do you think is causing the symptoms?"     Not that I can think of, it just started  5. OTHER SYMPTOMS: "Do you have any other symptoms?" (e.g., blood in urine, fever, flank pain, pain with urination)     Extreme thirst  Protocols used: Urinary  Symptoms-A-AH

## 2023-05-05 ENCOUNTER — Encounter: Payer: Self-pay | Admitting: Urology

## 2023-05-05 ENCOUNTER — Other Ambulatory Visit: Payer: Self-pay

## 2023-05-05 DIAGNOSIS — R350 Frequency of micturition: Secondary | ICD-10-CM

## 2023-05-05 NOTE — Progress Notes (Deleted)
   Assessment: 1. Urinary frequency      Plan: ***  Chief Complaint: No chief complaint on file.   History of Present Illness:  Alex Cruz is a 37 y.o. male who is seen in consultation from Sheldon, Salvadore Oxford, NP for evaluation of urinary frequency.   Past Medical History:  Past Medical History:  Diagnosis Date   COVID-19 06/10/2020   No known health problems     Past Surgical History:  No past surgical history on file.  Allergies:  No Known Allergies  Family History:  Family History  Problem Relation Age of Onset   Healthy Mother    Hypertension Father    Heart attack Father    Colon cancer Neg Hx    Prostate cancer Neg Hx     Social History:  Social History   Tobacco Use   Smoking status: Never   Smokeless tobacco: Never  Vaping Use   Vaping status: Never Used  Substance Use Topics   Alcohol use: Not Currently    Comment: social   Drug use: No    Review of symptoms:  Constitutional:  Negative for unexplained weight loss, night sweats, fever, chills ENT:  Negative for nose bleeds, sinus pain, painful swallowing CV:  Negative for chest pain, shortness of breath, exercise intolerance, palpitations, loss of consciousness Resp:  Negative for cough, wheezing, shortness of breath GI:  Negative for nausea, vomiting, diarrhea, bloody stools GU:  Positives noted in HPI; otherwise negative for gross hematuria, dysuria, urinary incontinence Neuro:  Negative for seizures, poor balance, limb weakness, slurred speech Psych:  Negative for lack of energy, depression, anxiety Endocrine:  Negative for polydipsia, polyuria, symptoms of hypoglycemia (dizziness, hunger, sweating) Hematologic:  Negative for anemia, purpura, petechia, prolonged or excessive bleeding, use of anticoagulants  Allergic:  Negative for difficulty breathing or choking as a result of exposure to anything; no shellfish allergy; no allergic response (rash/itch) to materials, foods  Physical  exam: There were no vitals taken for this visit. GENERAL APPEARANCE:  Well appearing, well developed, well nourished, NAD HEENT: Atraumatic, Normocephalic, oropharynx clear. NECK: Supple without lymphadenopathy or thyromegaly. LUNGS: Clear to auscultation bilaterally. HEART: Regular Rate and Rhythm without murmurs, gallops, or rubs. ABDOMEN: Soft, non-tender, No Masses. EXTREMITIES: Moves all extremities well.  Without clubbing, cyanosis, or edema. NEUROLOGIC:  Alert and oriented x 3, normal gait, CN II-XII grossly intact.  MENTAL STATUS:  Appropriate. BACK:  Non-tender to palpation.  No CVAT SKIN:  Warm, dry and intact.   GU: Penis:  {Exam; penis:5791} Meatus: {Meatus:15530} Scrotum: {pe scrotum:310183} Testis: {Exam; testicles:5790} Epididymis: {epididymis AOZH:086578} Prostate: {Exam; prostate:5793} Rectum: {rectal exam:26517}   Results: U/A:  PVR:

## 2023-05-06 ENCOUNTER — Encounter: Payer: Self-pay | Admitting: Urology

## 2023-05-06 ENCOUNTER — Ambulatory Visit (INDEPENDENT_AMBULATORY_CARE_PROVIDER_SITE_OTHER): Payer: Self-pay | Admitting: Urology

## 2023-05-06 VITALS — BP 151/87 | HR 85 | Ht 67.0 in | Wt 186.0 lb

## 2023-05-06 DIAGNOSIS — R350 Frequency of micturition: Secondary | ICD-10-CM

## 2023-05-06 DIAGNOSIS — R3915 Urgency of urination: Secondary | ICD-10-CM

## 2023-05-06 DIAGNOSIS — R351 Nocturia: Secondary | ICD-10-CM

## 2023-05-06 LAB — BLADDER SCAN AMB NON-IMAGING

## 2023-05-06 MED ORDER — DOXYCYCLINE HYCLATE 100 MG PO CAPS: 100.0000 mg | ORAL_CAPSULE | Freq: Two times a day (BID) | ORAL | 0 refills | Status: DC

## 2023-05-06 MED ORDER — CELECOXIB 200 MG PO CAPS: 200.0000 mg | ORAL_CAPSULE | Freq: Two times a day (BID) | ORAL | 0 refills | Status: DC

## 2023-05-06 NOTE — Patient Instructions (Signed)

## 2023-05-06 NOTE — Progress Notes (Signed)
   05/06/23 12:18 PM   Alex Cruz 10/30/1985 [id number]  CC: Urinary frequency  HPI: Healthy 37 year old male who reports about a month of increased urinary frequency, urgency, nocturia of unclear etiology.  He denies any new medications or change in diet or lifestyle.  He thinks he may be consuming more fluids but is not sure.  He also is having nocturia 5-6 times overnight despite trying to minimize fluid prior to bedtime.  He denies any dysuria or gross hematuria.  Urinalysis with PCP was benign on 04/23/2023.  He donates plasma regularly and is tested every month for STDs which have all been negative.  Bladder scan today , but last void was over an hour ago and he has had at least 2 bottles of water since then.   PMH: Past Medical History:  Diagnosis Date   COVID-19 06/10/2020   No known health problems     Family History: Family History  Problem Relation Age of Onset   Healthy Mother    Hypertension Father    Heart attack Father    Colon cancer Neg Hx    Prostate cancer Neg Hx     Social History:  reports that he has never smoked. He has never been exposed to tobacco smoke. He has never used smokeless tobacco. He reports that he does not currently use alcohol. He reports that he does not use drugs.  Physical Exam: BP (!) 151/87   Pulse 85   Ht 5\' 7"  (1.702 m)   Wt 186 lb (84.4 kg)   BMI 29.13 kg/m    Constitutional:  Alert and oriented, No acute distress. Cardiovascular: No clubbing, cyanosis, or edema. Respiratory: Normal respiratory effort, no increased work of breathing. GI: Abdomen is soft, nontender, nondistended, no abdominal masses GU: Phallus with patent meatus, no lesions, testicles 20 cc and descended bilaterally without masses  Laboratory Data: Urinalysis benign, see HPI  Assessment & Plan:   37 year old male with 4 weeks of increased urinary urgency, frequency, nocturia of unclear etiology.  Urinalysis benign, bladder scan normal.  We reviewed  possible causes including idiopathic, diabetes insipidus, interstitial cystitis, urethral stricture, pelvic floor dysfunction, urethral stricture, or urethritis.  He would like to avoid any invasive testing upfront.  Challenges of diagnosing and treating dysuria in the setting of benign urinalysis reviewed at length.  I think it would be reasonable to trial a course of doxycycline and Celebrex for possible urethritis/pelvic floor dysfunction.  I recommended close follow-up in 3 to 4 weeks for cystoscopy, can cancel if symptoms have resolved.   Legrand Rams, MD 05/06/2023  [facility] [address], [address] [place], [place] [address] [phone]

## 2023-05-12 ENCOUNTER — Ambulatory Visit: Payer: Self-pay | Admitting: *Deleted

## 2023-05-12 NOTE — Telephone Encounter (Signed)
Summary: frequent urination   Pt called in has been to the Ed and received med He says it doesn't seem to be helping and he has frequent urination still.             Called patient [phone] to review sx or urinary frequency. No answer, LVMTCB [phone].

## 2023-05-12 NOTE — Telephone Encounter (Signed)
Message from [place] B sent at 05/12/2023  1:15 PM EDT  Summary: frequent urination   Pt called in has been to the Ed and received med He says it doesn't seem to be helping and he has frequent urination still.         Chief Complaint: urinary frequency Symptoms: excessive thrist Frequency: 1 month Pertinent Negatives: Patient denies burning with urination Disposition: [] ED /[] Urgent Care (no appt availability in office) / [x] Appointment(In office/virtual)/ []  [place] [facility]/ [] Home Care/ [] Refused Recommended Disposition /[] [place] [facility]/ []  Follow-up with PCP Additional Notes: -  Reason for Disposition  Urinating more frequently than usual (i.e., frequency)  Answer Assessment - Initial Assessment Questions 1. SYMPTOM: "What's the main symptom you're concerned about?" (e.g., frequency, incontinence)     Frequency  2. ONSET: "When did the  sx  start?"     1 month 3. PAIN: "Is there any pain?" If Yes, ask: "How bad is it?" (Scale: 1-10; mild, moderate, severe)     no 4. CAUSE: "What do you think is causing the symptoms?"     UTI 5. OTHER SYMPTOMS: "Do you have any other symptoms?" (e.g., blood in urine, fever, flank pain, pain with urination)     No  Thirsty  Protocols used: Urinary Symptoms-A-AH

## 2023-05-13 ENCOUNTER — Encounter: Payer: Self-pay | Admitting: Internal Medicine

## 2023-05-13 ENCOUNTER — Ambulatory Visit (INDEPENDENT_AMBULATORY_CARE_PROVIDER_SITE_OTHER): Payer: Self-pay | Admitting: Internal Medicine

## 2023-05-13 VITALS — BP 110/72 | HR 66 | Ht 67.0 in | Wt 182.0 lb

## 2023-05-13 DIAGNOSIS — R631 Polydipsia: Secondary | ICD-10-CM

## 2023-05-13 DIAGNOSIS — E7800 Pure hypercholesterolemia, unspecified: Secondary | ICD-10-CM | POA: Insufficient documentation

## 2023-05-13 DIAGNOSIS — R350 Frequency of micturition: Secondary | ICD-10-CM

## 2023-05-13 LAB — POCT GLYCOSYLATED HEMOGLOBIN (HGB A1C): Hemoglobin A1C: 5.5 % (ref 4.0–5.6)

## 2023-05-13 NOTE — Patient Instructions (Signed)
Urinary Frequency, Adult Urinary frequency means urinating more often than usual. You may urinate every 1-2 hours even though you drink a normal amount of fluid and do not have a bladder infection or condition. Although you urinate more often than normal, the total amount of urine produced in a day is normal. With urinary frequency, you may have an urgent need to urinate often. The stress and anxiety of needing to find a bathroom quickly can make this urge worse. This condition may go away on its own, or you may need treatment at home. Home treatment may include bladder training, exercises, taking medicines, or making changes to your diet. Follow these instructions at home: Bladder health Your health care provider will tell you what to do to improve bladder health. You may be told to: Keep a bladder diary. Keep track of: What you eat and drink. How often you urinate. How much you urinate. Follow a bladder training program. This may include: Learning to delay going to the bathroom. Double urinating, also called voiding. This helps if you are not completely emptying your bladder. Scheduled voiding. Do Kegel exercises. Kegel exercises strengthen the muscles that help control urination, which may help the condition.  Eating and drinking Follow instructions from your health care provider about eating or drinking restrictions. You may be told to: Avoid caffeine. Drink fewer fluids, especially alcohol. Avoid drinking in the evening. Avoid foods or drinks that may irritate the bladder. These include coffee, tea, soda, artificial sweeteners, citrus, tomato-based foods, and chocolate. Eat foods that help prevent or treat constipation. Constipation can make urinary frequency worse. You may need to take these actions to prevent or treat constipation: Drink enough fluid to keep your urine pale yellow. Take over-the-counter or prescription medicines. Eat foods that are high in fiber, such as beans, whole  grains, and fresh fruits and vegetables. Limit foods that are high in fat and processed sugars, such as fried or sweet foods. General instructions Take over-the-counter and prescription medicines only as told by your health care provider. Keep all follow-up visits. This is important. Contact a health care provider if: You start urinating more often. You feel pain or irritation when you urinate. You notice blood in your urine. Your urine looks cloudy. You develop a fever. You begin vomiting. Get help right away if: You are unable to urinate. Summary Urinary frequency means urinating more often than usual. With urinary frequency, you may urinate every 1-2 hours even though you drink a normal amount of fluid and do not have a bladder infection or other bladder condition. Your health care provider may recommend that you keep a bladder diary, follow a bladder training program, or make dietary changes. If told by your health care provider, do Kegel exercises to strengthen the muscles that help control urination. Take over-the-counter and prescription medicines only as told by your health care provider. Contact a health care provider if your symptoms do not improve or get worse. This information is not intended to replace advice given to you by your health care provider. Make sure you discuss any questions you have with your health care provider. Document Revised: 05/12/2020 Document Reviewed: 05/12/2020 Elsevier Patient Education  2024 [employer]  

## 2023-05-13 NOTE — Progress Notes (Signed)
Subjective:    Patient ID: Alex Cruz, male    DOB: Jul 03, 1986, 37 y.o.   MRN: [id number]  HPI  Patient presents to the clinic today with complaint of increased thirst and urinary frequency.  He noticed this over the last month.  He reports his urine is clear all the time.  He saw urology for the same 7/16.  Urinalysis was negative. He was given celebrex and doxycyline for pelvic floor dysfunction.  He was advised to follow-up with his PCP. He has no family history of diabetes. He is working outside in [employer], but feels like he has been well hydrated.  Review of Systems     Past Medical History:  Diagnosis Date   COVID-19 06/10/2020   No known health problems     Current Outpatient Medications  Medication Sig Dispense Refill   acetaminophen (TYLENOL) 500 MG tablet Take 500 mg by mouth every 6 (six) hours as needed.     albuterol (VENTOLIN HFA) 108 (90 Base) MCG/ACT inhaler Inhale 2 puffs into the lungs every 6 (six) hours as needed for wheezing or shortness of breath. 8 g 2   celecoxib (CELEBREX) 200 MG capsule Take 1 capsule (200 mg total) by mouth 2 (two) times daily. 14 capsule 0   doxycycline (VIBRAMYCIN) 100 MG capsule Take 1 capsule (100 mg total) by mouth every 12 (twelve) hours. 14 capsule 0   phenazopyridine (PYRIDIUM) 200 MG tablet Take 1 tablet (200 mg total) by mouth 3 (three) times daily. 30 tablet 0   No current facility-administered medications for this visit.    No Known Allergies  Family History  Problem Relation Age of Onset   Healthy Mother    Hypertension Father    Heart attack Father    Colon cancer Neg Hx    Prostate cancer Neg Hx     Social History   Socioeconomic History   Marital status: Single    Spouse name: Not on file   Number of children: Not on file   Years of education: Not on file   Highest education level: Not on file  Occupational History   Not on file  Tobacco Use   Smoking status: Never    Passive exposure: Never    Smokeless tobacco: Never  Vaping Use   Vaping status: Never Used  Substance and Sexual Activity   Alcohol use: Not Currently    Comment: social   Drug use: No   Sexual activity: Not on file  Other Topics Concern   Not on file  Social History Narrative   Not on file   Social Determinants of Health   Financial Resource Strain: Not on file  Food Insecurity: Not on file  Transportation Needs: Not on file  Physical Activity: Not on file  Stress: Not on file  Social Connections: Not on file  Intimate Partner Violence: Not on file     Constitutional: Denies fever, malaise, fatigue, headache or abrupt weight changes.  HEENT: Denies eye pain, eye redness, ear pain, ringing in the ears, wax buildup, runny nose, nasal congestion, bloody nose, or sore throat. Respiratory: Denies difficulty breathing, shortness of breath, cough or sputum production.   Cardiovascular: Denies chest pain, chest tightness, palpitations or swelling in the hands or feet.  Gastrointestinal: Patient reports increased thirst.  Denies abdominal pain, bloating, constipation, diarrhea or blood in the stool.  GU: Patient reports urinary frequency.  Denies urgency, pain with urination, burning sensation, blood in urine, odor or discharge. Musculoskeletal: Patient  reports chronic low back pain.  Denies decrease in range of motion, difficulty with gait, or joint swelling.  Skin: Denies redness, rashes, lesions or ulcercations.  Neurological: Denies dizziness, difficulty with memory, difficulty with speech or problems with balance and coordination.  Psych: Denies anxiety, depression, SI/HI.  No other specific complaints in a complete review of systems (except as listed in HPI above).  Objective:   Physical Exam BP 110/72   Pulse 66   Ht 5\' 7"  (1.702 m)   Wt 182 lb (82.6 kg)   SpO2 98%   BMI 28.51 kg/m   Wt Readings from Last 3 Encounters:  05/06/23 186 lb (84.4 kg)  10/29/22 185 lb (83.9 kg)  10/18/22 182 lb 9.6  oz (82.8 kg)    General: Appears his stated age, overweight, in NAD. Skin: Warm, dry and intact.  HEENT: Head: normal shape and size; Eyes: sclera white, no icterus, conjunctiva pink, PERRLA and EOMs intact; Neck:  Neck supple, trachea midline. No masses, lumps or thyromegaly present.  Cardiovascular: Normal rate and rhythm. S1,S2 noted.  No murmur, rubs or gallops noted. No JVD or BLE edema.  Pulmonary/Chest: Normal effort and positive vesicular breath sounds. No respiratory distress. No wheezes, rales or ronchi noted.  Musculoskeletal: No difficulty with gait.  Neurological: Alert and oriented. Coordination normal.     BMET    Component Value Date/Time   NA 138 10/29/2022 0823   K 4.5 10/29/2022 0823   CL 104 10/29/2022 0823   CO2 26 10/29/2022 0823   GLUCOSE 88 10/29/2022 0823   BUN 17 10/29/2022 0823   CREATININE 0.87 10/29/2022 0823   CALCIUM 9.3 10/29/2022 0823   GFRNONAA 116 09/18/2020 0920   GFRAA 135 09/18/2020 0920    Lipid Panel     Component Value Date/Time   CHOL 189 10/29/2022 0823   TRIG 129 10/29/2022 0823   HDL 44 10/29/2022 0823   CHOLHDL 4.3 10/29/2022 0823   LDLCALC 121 (H) 10/29/2022 0823    CBC    Component Value Date/Time   WBC 8.2 10/29/2022 0823   RBC 5.45 10/29/2022 0823   HGB 17.3 (H) 10/29/2022 0823   HCT 48.6 10/29/2022 0823   PLT 237 10/29/2022 0823   MCV 89.2 10/29/2022 0823   MCH 31.7 10/29/2022 0823   MCHC 35.6 10/29/2022 0823   RDW 12.7 10/29/2022 0823   LYMPHSABS 2,067 09/18/2020 0920   MONOABS 0.2 06/17/2020 1627   EOSABS 109 09/18/2020 0920   BASOSABS 58 09/18/2020 0920    Hgb A1C Lab Results  Component Value Date   HGBA1C 5.4 10/29/2022            Assessment & Plan:   Polydipsia, urinary frequency:  Will check A1c, TSH and BMET today No indication to repeat urinalysis at this time Continue doxycycline and celebrex per urology for pelvic floor dysfunction   RTC in 6 months for your annual exam Nicki Reaper, NP

## 2023-05-14 ENCOUNTER — Ambulatory Visit: Payer: Self-pay

## 2023-05-14 LAB — BASIC METABOLIC PANEL WITH GFR
BUN: 10 mg/dL (ref 7–25)
CO2: 29 mmol/L (ref 20–32)
Calcium: 9.8 mg/dL (ref 8.6–10.3)
Chloride: 108 mmol/L (ref 98–110)
Creat: 0.96 mg/dL (ref 0.60–1.26)
Glucose, Bld: 88 mg/dL (ref 65–139)
Potassium: 4.6 mmol/L (ref 3.5–5.3)
Sodium: 144 mmol/L (ref 135–146)
eGFR: 105 mL/min/{1.73_m2} (ref >=60)

## 2023-05-14 LAB — TSH: TSH: 0.79 mIU/L (ref 0.40–4.50)

## 2023-05-14 NOTE — Telephone Encounter (Signed)
At this point, I see no cause for his symptoms.  He is currently being treated with Celebrex and doxycycline per urology.  My recommendation would be to follow-up with them

## 2023-05-14 NOTE — Telephone Encounter (Signed)
Patient informed to follow up with urology

## 2023-05-14 NOTE — Telephone Encounter (Signed)
Message from [place] M sent at 05/14/2023  1:15 PM EDT  Summary: Frequent urination, clear urine, and an excessively thirsty urine stream is not as strong.   Pt stated he saw PCP comments on recent labs and that they are normal but wants to know what the next step is as he is still having the same symptoms. Frequent urination, clear urine, and an excessively thirsty urine stream is not as strong.  Seeking clinical advice.         Chief Complaint: pt asked what is the next step to alleviate his sx. Pt asked if the urologist appt. Is the next step. Pt stated his sx are interfering his sleep and his life. Asked pt if he is getting down or discouraged and he tearfully verbalized "yes." Pt would like appt ASAP.  Symptoms: Frequent urination, excessively thirsty.  Disposition: [] ED /[] Urgent Care (no appt availability in office) / [] Appointment(In office/virtual)/ []  [facility]/ [] Home Care/ [] Refused Recommended Disposition /[] [facility]/ [x]  Follow-up with PCP Additional Notes: -  Reason for Disposition  [1] Caller requesting NON-URGENT health information AND [2] PCP's office is the best resource  Answer Assessment - Initial Assessment Questions 1. REASON FOR CALL or QUESTION: "What is your reason for calling today?" or "How can I best help you?" or "What question do you have that I can help answer?"     Pt stated he wants to know the next step in his treatment. Sx are persising.  Protocols used: Information Only Call - No Triage-A-AH

## 2023-05-14 NOTE — Telephone Encounter (Signed)
Message from [place] M sent at 05/14/2023  1:15 PM EDT  Summary: Frequent urination, clear urine, and an excessively thirsty urine stream is not as strong.   Pt stated he saw PCP comments on recent labs and that they are normal but wants to know what the next step is as he is still having the same symptoms. Frequent urination, clear urine, and an excessively thirsty urine stream is not as strong.  Seeking clinical advice.        Called pt and LM on VM to call back.

## 2023-06-03 ENCOUNTER — Ambulatory Visit (INDEPENDENT_AMBULATORY_CARE_PROVIDER_SITE_OTHER): Payer: Self-pay | Admitting: Urology

## 2023-06-03 ENCOUNTER — Encounter: Payer: Self-pay | Admitting: Urology

## 2023-06-03 VITALS — BP 126/84 | HR 93 | Ht 67.0 in | Wt 182.0 lb

## 2023-06-03 DIAGNOSIS — R350 Frequency of micturition: Secondary | ICD-10-CM

## 2023-06-03 DIAGNOSIS — R351 Nocturia: Secondary | ICD-10-CM

## 2023-06-03 MED ORDER — GEMTESA 75 MG PO TABS: 1.0000 | ORAL_TABLET | Freq: Every day | ORAL | Status: AC

## 2023-06-03 NOTE — Progress Notes (Signed)
Cystoscopy Procedure Note:  Indication: Urinary frequency and nocturia  37 year old male who I originally saw 05/06/2023 for urinary frequency, urgency, nocturia of unclear etiology.  He is voiding every 45 minutes to an hour during the day and night.  He is drinking primarily water.  PVRs have been normal, lab work including BMP benign.  We originally trial of doxycycline and Celebrex for possible urethritis or pelvic floor dysfunction, but he had no improvement on those medications.  After informed consent and discussion of the procedure and its risks, Alex Cruz was positioned and prepped in the standard fashion.  Subtle narrowing at the fossa navicularis and this was gently dilated with a hemostat.  Cystoscopy was performed with a flexible cystoscope. The urethra, bladder neck and entire bladder was visualized in a standard fashion. The prostate was small. The ureteral orifices were visualized in their normal location and orientation.  Bladder grossly normal.  No abnormalities on retroflexion.   Findings: Normal cystoscopy, subtle fossa navicularis stricture dilated but unlikely to be clinically significant  Assessment and Plan: -I recommended referral to endocrinology to evaluate for possible diabetes insipidus -Trial of Gemtesa while awaiting endocrinology workup, samples given, okay to refill if helpful -Consider pelvic floor dysfunction/pelvic floor PT in the future if negative workup with endocrinology  Legrand Rams, MD 06/03/2023

## 2023-08-01 NOTE — Progress Notes (Deleted)
08/04/2023 8:15 AM   Alex Cruz Feb 22, 1986 [id number]  Referring provider: Lorre Munroe, NP [address] [place],  [place] [address]  Urological history: 1. OAB -Contributing factors of COVID -cysto (05/2023) - subtle fossa navicularis stricture  No chief complaint on file.  HPI: Alex Cruz is a 37 y.o. male who presents today for follow up after a trial of Gemtesa.    Previous records reviewed.   He was initially seen May 06, 2023 by Dr. Richardo Hanks for symptoms of urinary frequency, urgency and nocturia.  He underwent cystoscopy which did not demonstrate any etiology for his symptoms.  He is placed on a regimen of Gemtesa 75 mg daily and instructed to follow-up.    PMH: Past Medical History:  Diagnosis Date   COVID-19 06/10/2020   No known health problems     Surgical History: No past surgical history on file.  Home Medications:  Allergies as of 08/04/2023   No Known Allergies      Medication List        Accurate as of August 01, 2023  8:15 AM. If you have any questions, ask your nurse or doctor.          acetaminophen 500 MG tablet Commonly known as: TYLENOL Take 500 mg by mouth every 6 (six) hours as needed.   albuterol 108 (90 Base) MCG/ACT inhaler Commonly known as: VENTOLIN HFA Inhale 2 puffs into the lungs every 6 (six) hours as needed for wheezing or shortness of breath.   Gemtesa 75 MG Tabs Generic drug: Vibegron Take 1 tablet (75 mg total) by mouth daily.        Allergies: No Known Allergies  Family History: Family History  Problem Relation Age of Onset   Healthy Mother    Hypertension Father    Heart attack Father    Colon cancer Neg Hx    Prostate cancer Neg Hx     Social History:  reports that he has never smoked. He has never been exposed to tobacco smoke. He has never used smokeless tobacco. He reports that he does not currently use alcohol. He reports that he does not use drugs.  ROS: Pertinent ROS in  HPI  Physical Exam: There were no vitals taken for this visit.  Constitutional:  Well nourished. Alert and oriented, No acute distress. HEENT: [place] AT, moist mucus membranes.  Trachea midline, no masses. Cardiovascular: No clubbing, cyanosis, or edema. Respiratory: Normal respiratory effort, no increased work of breathing. GI: Abdomen is soft, non tender, non distended, no abdominal masses. Liver and spleen not palpable.  No hernias appreciated.  Stool sample for occult testing is not indicated.   GU: No CVA tenderness.  No bladder fullness or masses.  Patient with circumcised/uncircumcised phallus. ***Foreskin easily retracted***  Urethral meatus is patent.  No penile discharge. No penile lesions or rashes. Scrotum without lesions, cysts, rashes and/or edema.  Testicles are located scrotally bilaterally. No masses are appreciated in the testicles. Left and right epididymis are normal. Rectal: Patient with  normal sphincter tone. Anus and perineum without scarring or rashes. No rectal masses are appreciated. Prostate is approximately *** grams, *** nodules are appreciated. Seminal vesicles are normal. Skin: No rashes, bruises or suspicious lesions. Lymph: No cervical or inguinal adenopathy. Neurologic: Grossly intact, no focal deficits, moving all 4 extremities. Psychiatric: Normal mood and affect.  Laboratory Data: Lab Results  Component Value Date   WBC 8.2 10/29/2022   HGB 17.3 (H) 10/29/2022  HCT 48.6 10/29/2022   MCV 89.2 10/29/2022   PLT 237 10/29/2022    Lab Results  Component Value Date   CREATININE 0.96 05/13/2023     Lab Results  Component Value Date   HGBA1C 5.5 05/13/2023    Lab Results  Component Value Date   TSH 0.79 05/13/2023       Component Value Date/Time   CHOL 189 10/29/2022 0823   HDL 44 10/29/2022 0823   CHOLHDL 4.3 10/29/2022 0823   LDLCALC 121 (H) 10/29/2022 0823    Lab Results  Component Value Date   AST 14 10/29/2022   Lab Results   Component Value Date   ALT 22 10/29/2022   Urinalysis See HPI and EPIC  I have reviewed the labs.   Pertinent Imaging: ***  Assessment & Plan:  ***  1. OAB -UA *** -Urine sent for atypical cultures -PVR demonstrates adequate bladder emptying  No follow-ups on file.  These notes generated with voice recognition software. I apologize for typographical errors.  Alex Cruz  [facility] [address]  [address] [place], [place] [address] [phone]

## 2023-08-04 ENCOUNTER — Ambulatory Visit: Payer: Medicaid Other | Admitting: Urology

## 2023-08-04 DIAGNOSIS — R350 Frequency of micturition: Secondary | ICD-10-CM

## 2023-11-03 ENCOUNTER — Encounter: Payer: Medicaid Other | Admitting: Internal Medicine

## 2023-11-03 NOTE — Progress Notes (Deleted)
 Subjective:    Patient ID: Alex Cruz, male    DOB: [birth year], 38 y.o.   MRN: [id number]  HPI  Patient presents to clinic today for his annual exam.  Flu: 10/2022 Tetanus: < 10 years ago COVID: Moderna x 1 Dentist: biannually  Diet: He does eat meat. He consumes some fruits and veggies. He does eat some fried foods. He drinks mostly water, soda. Exercise: Running  Review of Systems     Past Medical History:  Diagnosis Date   COVID-19 06/10/2020   No known health problems     Current Outpatient Medications  Medication Sig Dispense Refill   acetaminophen  (TYLENOL ) 500 MG tablet Take 500 mg by mouth every 6 (six) hours as needed.     albuterol  (VENTOLIN  HFA) 108 (90 Base) MCG/ACT inhaler Inhale 2 puffs into the lungs every 6 (six) hours as needed for wheezing or shortness of breath. 8 g 2   Vibegron  (GEMTESA ) 75 MG TABS Take 1 tablet (75 mg total) by mouth daily.     No current facility-administered medications for this visit.    No Known Allergies  Family History  Problem Relation Age of Onset   Healthy Mother    Hypertension Father    Heart attack Father    Colon cancer Neg Hx    Prostate cancer Neg Hx     Social History   Socioeconomic History   Marital status: Single    Spouse name: Not on file   Number of children: Not on file   Years of education: Not on file   Highest education level: Not on file  Occupational History   Not on file  Tobacco Use   Smoking status: Never    Passive exposure: Never   Smokeless tobacco: Never  Vaping Use   Vaping status: Never Used  Substance and Sexual Activity   Alcohol use: Not Currently    Comment: social   Drug use: No   Sexual activity: Not on file  Other Topics Concern   Not on file  Social History Narrative   Not on file   Social Drivers of Health   Financial Resource Strain: Not on file  Food Insecurity: Not on file  Transportation Needs: Not on file  Physical Activity: Not on file  Stress:  Not on file  Social Connections: Not on file  Intimate Partner Violence: Not on file     Constitutional: Denies fever, malaise, fatigue, headache or abrupt weight changes.  HEENT: Denies eye pain, eye redness, ear pain, ringing in the ears, wax buildup, runny nose, nasal congestion, bloody nose, or sore throat. Respiratory: Denies difficulty breathing, shortness of breath, cough or sputum production.   Cardiovascular: Denies chest pain, chest tightness, palpitations or swelling in the hands or feet.  Gastrointestinal: Denies abdominal pain, bloating, constipation, diarrhea or blood in the stool.  GU: Denies urgency, frequency, pain with urination, burning sensation, blood in urine, odor or discharge. Musculoskeletal: Patient reports intermittent low back pain.  Denies decrease in range of motion, difficulty with gait, muscle pain or joint swelling.  Skin: Denies redness, rashes, lesions or ulcercations.  Neurological: Denies dizziness, difficulty with memory, difficulty with speech or problems with balance and coordination.  Psych: Denies anxiety, depression, SI/HI.  No other specific complaints in a complete review of systems (except as listed in HPI above).  Objective:   Physical Exam  There were no vitals taken for this visit.  Wt Readings from Last 3 Encounters:  06/03/23 182 lb (  82.6 kg)  05/13/23 182 lb (82.6 kg)  05/06/23 186 lb (84.4 kg)    General: Appears his stated age, overweight, in NAD. Skin: Warm, dry and intact. HEENT: Head: normal shape and size; Eyes: sclera white, no icterus, conjunctiva pink, PERRLA and EOMs intact;  Neck:  Neck supple, trachea midline. No masses, lumps or thyromegaly present.  Cardiovascular: Normal rate and rhythm. S1,S2 noted.  No murmur, rubs or gallops noted. No JVD or BLE edema.  Pulmonary/Chest: Normal effort and positive vesicular breath sounds. No respiratory distress. No wheezes, rales or ronchi noted.  Abdomen: Normal bowel sounds.   Musculoskeletal: Strength 5/5 BUE/BLE.  No difficulty with gait.  Neurological: Alert and oriented. Cranial nerves II-XII grossly intact. Coordination normal.  Psychiatric: Mood and affect normal. Behavior is normal. Judgment and thought content normal.    BMET    Component Value Date/Time   NA 144 05/13/2023 0841   K 4.6 05/13/2023 0841   CL 108 05/13/2023 0841   CO2 29 05/13/2023 0841   GLUCOSE 88 05/13/2023 0841   BUN 10 05/13/2023 0841   CREATININE 0.96 05/13/2023 0841   CALCIUM 9.8 05/13/2023 0841   GFRNONAA 116 09/18/2020 0920   GFRAA 135 09/18/2020 0920    Lipid Panel     Component Value Date/Time   CHOL 189 10/29/2022 0823   TRIG 129 10/29/2022 0823   HDL 44 10/29/2022 0823   CHOLHDL 4.3 10/29/2022 0823   LDLCALC 121 (H) 10/29/2022 0823    CBC    Component Value Date/Time   WBC 8.2 10/29/2022 0823   RBC 5.45 10/29/2022 0823   HGB 17.3 (H) 10/29/2022 0823   HCT 48.6 10/29/2022 0823   PLT 237 10/29/2022 0823   MCV 89.2 10/29/2022 0823   MCH 31.7 10/29/2022 0823   MCHC 35.6 10/29/2022 0823   RDW 12.7 10/29/2022 0823   LYMPHSABS 2,067 09/18/2020 0920   MONOABS 0.2 06/17/2020 1627   EOSABS 109 09/18/2020 0920   BASOSABS 58 09/18/2020 0920    Hgb A1C Lab Results  Component Value Date   HGBA1C 5.5 05/13/2023            Assessment & Plan:   Preventative Health Maintenance:  Flu shot today He declines tetanus today Encouraged to get his COVID booster Encouraged him to consume a balanced diet and exercise regimen Advised him to see an eye doctor and dentist annually We will check CBC, c-Met, lipid, A1c today  RTC in 1 year, sooner if needed Angeline Laura, NP

## 2024-04-08 ENCOUNTER — Ambulatory Visit
Admission: EM | Admit: 2024-04-08 | Discharge: 2024-04-08 | Disposition: A | Discharge status: Home / Self Care | Attending: Family Medicine | Admitting: Family Medicine

## 2024-04-08 ENCOUNTER — Encounter: Payer: Self-pay | Admitting: Emergency Medicine

## 2024-04-08 DIAGNOSIS — J209 Acute bronchitis, unspecified: Secondary | ICD-10-CM

## 2024-04-08 DIAGNOSIS — Z8709 Personal history of other diseases of the respiratory system: Secondary | ICD-10-CM

## 2024-04-08 DIAGNOSIS — H66002 Acute suppurative otitis media without spontaneous rupture of ear drum, left ear: Secondary | ICD-10-CM

## 2024-04-08 MED ORDER — HYDROCOD POLI-CHLORPHE POLI ER 10-8 MG/5ML PO SUER: 5.0000 mL | Freq: Two times a day (BID) | ORAL | 0 refills | Status: AC | PRN

## 2024-04-08 MED ORDER — CEFDINIR 300 MG PO CAPS: 300.0000 mg | ORAL_CAPSULE | Freq: Two times a day (BID) | ORAL | 0 refills | Status: AC

## 2024-04-08 MED ORDER — PREDNISONE 10 MG (21) PO TBPK: ORAL_TABLET | Freq: Every day | ORAL | 0 refills | Status: AC

## 2024-04-08 NOTE — Discharge Instructions (Signed)
 Stop by the pharmacy to pick up your prescriptions.  Follow up with your primary care provider or return to the urgent care, if not improving.

## 2024-04-08 NOTE — ED Triage Notes (Signed)
 Pt st's 1 week ago he was having chills, coughing and sore throat   Also st's he had a left ear pain.  Today pt c/o continued chest congestion with non productive cough.  St's pain in left ear has subsided but now has hearing loss in that ear.

## 2024-04-08 NOTE — ED Provider Notes (Incomplete)
 [facility]    CSN: [id number] Arrival date & time: 04/08/24  9182      History   Chief Complaint No chief complaint on file.   HPI Alex Cruz is a 38 y.o. male.   HPI  History obtained from the patient. Alex Cruz presents for chills, sore throat, cough, ear pain, nasal congestion that started 7-8 days ago.  Left ear feels clogged. Cough is mostly dry and he is having coughing fits to the point he feels like that he is going to throw up at times. Tried Vicks nasal spray, Delsym , Theraflu, Tylenol  and albuterol  inhaler.  Took a few of antibiotics that he had leftover which didn't really help. No vomiting. Had diarrhea over the weekend.   Had asthma. He got COVID in 2021 that landed him the hospital. Denies vaping and smoking.     Past Medical History:  Diagnosis Date   COVID-19 06/10/2020   No known health problems     Patient Active Problem List   Diagnosis Date Noted   Pure hypercholesterolemia 05/13/2023   Overweight with body mass index (BMI) of 28 to 28.9 in adult 10/29/2022   Chronic low back pain 06/11/2017    No past surgical history on file.     Home Medications    Prior to Admission medications   Medication Sig Start Date End Date Taking? Authorizing Provider  acetaminophen  (TYLENOL ) 500 MG tablet Take 500 mg by mouth every 6 (six) hours as needed.    [provider]  albuterol  (VENTOLIN  HFA) 108 (90 Base) MCG/ACT inhaler Inhale 2 puffs into the lungs every 6 (six) hours as needed for wheezing or shortness of breath. 10/08/22   Alex Fetting, DO  Vibegron  (GEMTESA ) 75 MG TABS Take 1 tablet (75 mg total) by mouth daily. 06/03/23   Alex Redell BROCKS, MD    Family History Family History  Problem Relation Age of Onset   Healthy Mother    Hypertension Father    Heart attack Father    Colon cancer Neg Hx    Prostate cancer Neg Hx     Social History Social History   Tobacco Use   Smoking status: Never    Passive exposure:  Never   Smokeless tobacco: Never  Vaping Use   Vaping status: Never Used  Substance Use Topics   Alcohol use: Not Currently    Comment: social   Drug use: No     Allergies   Patient has no known allergies.   Review of Systems Review of Systems: negative unless otherwise stated in HPI.      Physical Exam Triage Vital Signs ED Triage Vitals  Encounter Vitals Group     BP      Girls Systolic BP Percentile      Girls Diastolic BP Percentile      Boys Systolic BP Percentile      Boys Diastolic BP Percentile      Pulse      Resp      Temp      Temp src      SpO2      Weight      Height      Head Circumference      Peak Flow      Pain Score      Pain Loc      Pain Education      Exclude from Growth Chart    No data found.  Updated Vital Signs There were no  vitals taken for this visit.  Visual Acuity Right Eye Distance:   Left Eye Distance:   Bilateral Distance:    Right Eye Near:   Left Eye Near:    Bilateral Near:     Physical Exam GEN:     alert, non-toxic appearing male in no distress ***   HENT:  mucus membranes moist, oropharyngeal ***without lesions or ***erythema, no*** tonsillar hypertrophy or exudates, *** moderate erythematous edematous turbinates, ***clear nasal discharge, ***bilateral TM normal EYES:   pupils equal and reactive, ***no scleral injection or discharge NECK:  normal ROM, no ***lymphadenopathy, ***no meningismus   RESP:  no increased work of breathing, ***clear to auscultation bilaterally CVS:   regular rate ***and rhythm Skin:   warm and dry, no rash on visible skin***    UC Treatments / Results  Labs (all labs ordered are listed, but only abnormal results are displayed) Labs Reviewed - No data to display  EKG   Radiology No results found.  Procedures Procedures (including critical care time)  Medications Ordered in UC Medications - No data to display  Initial Impression / Assessment and Plan / UC Course  I have  reviewed the triage vital signs and the nursing notes.  Pertinent labs & imaging results that were available during my care of the patient were reviewed by me and considered in my medical decision making (see chart for details).       Pt is a 38 y.o. male who presents for *** days of respiratory symptoms. Alex Cruz is ***afebrile here without recent antipyretics. Satting well on room air. Overall pt is ***non-toxic appearing, well hydrated, without respiratory distress. Pulmonary exam ***is unremarkable.  COVID and influenza panel obtained ***and was negative. ***Pt to quarantine until COVID test results or longer if positive.  I will call patient with test results, if positive. History consistent with ***viral respiratory illness. Discussed symptomatic treatment.  Explained lack of efficacy of antibiotics in viral disease.  Typical duration of symptoms discussed.   Return and ED precautions given and voiced understanding. Discussed MDM, treatment plan and plan for follow-up with patient*** who agrees with plan.     Final Clinical Impressions(s) / UC Diagnoses   Final diagnoses:  None   Discharge Instructions   None    ED Prescriptions   None    PDMP not reviewed this encounter.
